# Patient Record
Sex: Female | Born: 1960 | Race: Black or African American | Hispanic: No | Marital: Married | Smoking: Former smoker
Health system: Southern US, Community
[De-identification: ages and names within clinical notes are randomized; demographics above are authoritative.]

## PROBLEM LIST (undated history)

## (undated) DIAGNOSIS — G35 Multiple sclerosis: Secondary | ICD-10-CM

## (undated) DIAGNOSIS — I10 Essential (primary) hypertension: Secondary | ICD-10-CM

## (undated) DIAGNOSIS — D649 Anemia, unspecified: Secondary | ICD-10-CM

## (undated) HISTORY — PX: KNEE CARTILAGE SURGERY: SHX688

---

## 1984-07-18 HISTORY — PX: TUBAL LIGATION: SHX77

## 1992-03-18 DIAGNOSIS — G35 Multiple sclerosis: Secondary | ICD-10-CM

## 1992-03-18 HISTORY — DX: Multiple sclerosis: G35

## 1999-01-22 ENCOUNTER — Emergency Department (HOSPITAL_COMMUNITY): Admission: EM | Admit: 1999-01-22 | Discharge: 1999-01-22 | Payer: Self-pay | Admitting: Emergency Medicine

## 1999-03-09 ENCOUNTER — Ambulatory Visit (HOSPITAL_COMMUNITY): Admission: RE | Admit: 1999-03-09 | Discharge: 1999-03-09 | Payer: Self-pay | Admitting: Gastroenterology

## 2003-01-14 ENCOUNTER — Other Ambulatory Visit: Admission: RE | Admit: 2003-01-14 | Discharge: 2003-01-14 | Payer: Self-pay | Admitting: Obstetrics and Gynecology

## 2004-08-20 ENCOUNTER — Ambulatory Visit (HOSPITAL_COMMUNITY): Admission: RE | Admit: 2004-08-20 | Discharge: 2004-08-20 | Payer: Self-pay | Admitting: Gastroenterology

## 2008-11-26 ENCOUNTER — Encounter: Payer: Self-pay | Admitting: Family Medicine

## 2008-11-26 ENCOUNTER — Encounter (INDEPENDENT_AMBULATORY_CARE_PROVIDER_SITE_OTHER): Payer: Self-pay | Admitting: *Deleted

## 2008-11-26 LAB — CONVERTED CEMR LAB
ALT: 14 units/L
AST: 17 units/L
Albumin: 4.7 g/dL
Alkaline Phosphatase: 75 units/L
BUN: 7 mg/dL
CO2, serum: 28 mmol/L
Calcium: 10 mg/dL
Chloride, Serum: 104 mmol/L
Cholesterol: 171 mg/dL
Creatinine, Ser: 0.74 mg/dL
Globulin: 2.9 g/dL
Glucose, Bld: 92 mg/dL
HDL: 46 mg/dL
Hgb A1c MFr Bld: 5.5 %
LDL Cholesterol: 104 mg/dL
Potassium, serum: 4.9 mmol/L
Sodium, serum: 141 mmol/L
Total Bilirubin: 0.2 mg/dL
Total Protein: 7.6 g/dL
Triglycerides: 104 mg/dL

## 2009-01-06 ENCOUNTER — Ambulatory Visit: Payer: Self-pay | Admitting: Family Medicine

## 2009-01-06 DIAGNOSIS — M25519 Pain in unspecified shoulder: Secondary | ICD-10-CM | POA: Insufficient documentation

## 2009-01-09 ENCOUNTER — Encounter (INDEPENDENT_AMBULATORY_CARE_PROVIDER_SITE_OTHER): Payer: Self-pay | Admitting: *Deleted

## 2010-07-26 ENCOUNTER — Other Ambulatory Visit: Payer: Self-pay | Admitting: Family Medicine

## 2010-07-26 ENCOUNTER — Encounter: Payer: Self-pay | Admitting: Family Medicine

## 2010-07-26 ENCOUNTER — Ambulatory Visit
Admission: RE | Admit: 2010-07-26 | Discharge: 2010-07-26 | Payer: Self-pay | Source: Home / Self Care | Attending: Family Medicine | Admitting: Family Medicine

## 2010-07-26 DIAGNOSIS — E049 Nontoxic goiter, unspecified: Secondary | ICD-10-CM | POA: Insufficient documentation

## 2010-07-26 LAB — HEPATIC FUNCTION PANEL
ALT: 12 U/L (ref 0–35)
AST: 16 U/L (ref 0–37)
Albumin: 3.7 g/dL (ref 3.5–5.2)
Alkaline Phosphatase: 77 U/L (ref 39–117)
Bilirubin, Direct: 0.1 mg/dL (ref 0.0–0.3)
Total Bilirubin: 0.4 mg/dL (ref 0.3–1.2)
Total Protein: 7.1 g/dL (ref 6.0–8.3)

## 2010-07-26 LAB — CBC WITH DIFFERENTIAL/PLATELET
Basophils Absolute: 0 10*3/uL (ref 0.0–0.1)
Basophils Relative: 0.5 % (ref 0.0–3.0)
Eosinophils Absolute: 0.2 10*3/uL (ref 0.0–0.7)
Eosinophils Relative: 2.3 % (ref 0.0–5.0)
HCT: 32.2 % — ABNORMAL LOW (ref 36.0–46.0)
Hemoglobin: 10.3 g/dL — ABNORMAL LOW (ref 12.0–15.0)
Lymphocytes Relative: 37.6 % (ref 12.0–46.0)
Lymphs Abs: 2.5 10*3/uL (ref 0.7–4.0)
MCHC: 32.1 g/dL (ref 30.0–36.0)
MCV: 82.7 fl (ref 78.0–100.0)
Monocytes Absolute: 0.5 10*3/uL (ref 0.1–1.0)
Monocytes Relative: 6.9 % (ref 3.0–12.0)
Neutro Abs: 3.5 10*3/uL (ref 1.4–7.7)
Neutrophils Relative %: 52.7 % (ref 43.0–77.0)
Platelets: 556 10*3/uL — ABNORMAL HIGH (ref 150.0–400.0)
RBC: 3.89 Mil/uL (ref 3.87–5.11)
RDW: 16.2 % — ABNORMAL HIGH (ref 11.5–14.6)
WBC: 6.7 10*3/uL (ref 4.5–10.5)

## 2010-07-26 LAB — BASIC METABOLIC PANEL
BUN: 11 mg/dL (ref 6–23)
CO2: 27 mEq/L (ref 19–32)
Calcium: 9.7 mg/dL (ref 8.4–10.5)
Chloride: 108 mEq/L (ref 96–112)
Creatinine, Ser: 0.6 mg/dL (ref 0.4–1.2)
GFR: 139.24 mL/min (ref >60.00)
Glucose, Bld: 84 mg/dL (ref 70–99)
Potassium: 4.2 mEq/L (ref 3.5–5.1)
Sodium: 139 mEq/L (ref 135–145)

## 2010-07-26 LAB — LIPID PANEL
Cholesterol: 168 mg/dL (ref 0–200)
HDL: 42.9 mg/dL (ref >39.00)
LDL Cholesterol: 116 mg/dL — ABNORMAL HIGH (ref 0–99)
Total CHOL/HDL Ratio: 4
Triglycerides: 48 mg/dL (ref 0.0–149.0)
VLDL: 9.6 mg/dL (ref 0.0–40.0)

## 2010-07-26 LAB — TSH: TSH: 0.28 u[IU]/mL — ABNORMAL LOW (ref 0.35–5.50)

## 2010-07-27 ENCOUNTER — Telehealth (INDEPENDENT_AMBULATORY_CARE_PROVIDER_SITE_OTHER): Payer: Self-pay | Admitting: *Deleted

## 2010-07-27 LAB — T3, FREE: T3, Free: 2 pg/mL — ABNORMAL LOW (ref 2.3–4.2)

## 2010-07-27 LAB — CONVERTED CEMR LAB: Vit D, 25-Hydroxy: 17 ng/mL — ABNORMAL LOW (ref 30–89)

## 2010-07-27 LAB — T4, FREE: Free T4: 0.71 ng/dL (ref 0.60–1.60)

## 2010-07-30 ENCOUNTER — Encounter
Admission: RE | Admit: 2010-07-30 | Discharge: 2010-07-30 | Payer: Self-pay | Source: Home / Self Care | Attending: Family Medicine | Admitting: Family Medicine

## 2010-07-30 ENCOUNTER — Telehealth (INDEPENDENT_AMBULATORY_CARE_PROVIDER_SITE_OTHER): Payer: Self-pay | Admitting: *Deleted

## 2010-08-11 ENCOUNTER — Ambulatory Visit
Admission: RE | Admit: 2010-08-11 | Discharge: 2010-08-11 | Payer: Self-pay | Source: Home / Self Care | Attending: Family Medicine | Admitting: Family Medicine

## 2010-08-11 ENCOUNTER — Other Ambulatory Visit: Payer: Self-pay

## 2010-08-11 LAB — CBC WITH DIFFERENTIAL/PLATELET
Basophils Absolute: 0 10*3/uL (ref 0.0–0.1)
Basophils Relative: 0.5 % (ref 0.0–3.0)
Eosinophils Absolute: 0.3 10*3/uL (ref 0.0–0.7)
Eosinophils Relative: 4.7 % (ref 0.0–5.0)
HCT: 33.4 % — ABNORMAL LOW (ref 36.0–46.0)
Hemoglobin: 10.9 g/dL — ABNORMAL LOW (ref 12.0–15.0)
Lymphocytes Relative: 44.7 % (ref 12.0–46.0)
Lymphs Abs: 2.4 10*3/uL (ref 0.7–4.0)
MCHC: 32.7 g/dL (ref 30.0–36.0)
MCV: 84.5 fl (ref 78.0–100.0)
Monocytes Absolute: 0.4 10*3/uL (ref 0.1–1.0)
Monocytes Relative: 7.2 % (ref 3.0–12.0)
Neutro Abs: 2.3 10*3/uL (ref 1.4–7.7)
Neutrophils Relative %: 42.9 % — ABNORMAL LOW (ref 43.0–77.0)
Platelets: 507 10*3/uL — ABNORMAL HIGH (ref 150.0–400.0)
RBC: 3.96 Mil/uL (ref 3.87–5.11)
RDW: 18.8 % — ABNORMAL HIGH (ref 11.5–14.6)
WBC: 5.4 10*3/uL (ref 4.5–10.5)

## 2010-08-19 NOTE — Progress Notes (Signed)
Summary: Call [phone]=cell for Thyroid Ultrasound results  Phone Note Call from Patient   Caller: Patient Summary of Call: please call patient on her cell today for her Thyroid Ultrasound results----her cell number is [phone] Initial call taken by: Jerolyn Shin,  July 30, 2010 12:21 PM  Follow-up for Phone Call        spoke w/ patient aware of results of ultrasound...Marland KitchenMarland KitchenDoristine Devoid CMA  July 30, 2010 3:28 PM

## 2010-08-19 NOTE — Miscellaneous (Signed)
  Clinical Lists Changes  Observations: Added new observation of TRIGLYC TOT: 104 mg/dL (16/04/9603 5:40) Added new observation of LDL: 104 mg/dL (98/05/9146 8:29) Added new observation of HDL: 46 mg/dL (56/21/3086 5:78) Added new observation of CHOLESTEROL: 171 mg/dL (46/96/2952 8:41) Added new observation of BILI TOTAL: 0.2 mg/dL (32/44/0102 7:25) Added new observation of ALK PHOS: 75 units/L (11/26/2008 9:16) Added new observation of SGPT (ALT): 14 units/L (11/26/2008 9:16) Added new observation of SGOT (AST): 17 units/L (11/26/2008 9:16) Added new observation of PROTEIN, TOT: 7.6 g/dL (36/64/4034 7:42) Added new observation of GLOBULIN TOT: 2.9 g/dL (59/56/3875 6:43) Added new observation of ALBUMIN: 4.7 g/dL (32/95/1884 1:66) Added new observation of CALCIUM: 10.0 mg/dL (01/15/1600 0:93) Added new observation of HGBA1C: 5.5 % (11/26/2008 9:16) Added new observation of GLUCOSE SER: 92 mg/dL (23/55/7322 0:25) Added new observation of CREATININE: 0.74 mg/dL (42/70/6237 6:28) Added new observation of BUN: 7 mg/dL (31/51/7616 0:73) Added new observation of CO2 TOTAL: 28 mmol/L (11/26/2008 9:16) Added new observation of CHLORIDE: 104 mmol/L (11/26/2008 9:16) Added new observation of POTASSIUM: 4.9 mmol/L (11/26/2008 9:16) Added new observation of SODIUM: 141 mmol/L (11/26/2008 9:16)

## 2010-08-19 NOTE — Progress Notes (Signed)
Summary: labs  Phone Note Outgoing Call   Summary of Call: please refer pt to endocrinologist.  had low TSH which should indicate high T3/T4 but T3 is low.  will need additional w/u w/ endo to determine the cause.  level is low.  start 50,000 units weekly x12 weeks and then repeat level  labs show she is mildly anemic- should start iron supplement daily and stool softener as needed b/c these can cause constipation.  platelets are elevated- this could be due to current bronchitis.  repeat CBC in 2 weeks.  dx- thrombophilia    Follow-up for Phone Call        left message on machine ....Marland KitchenMarland KitchenDoristine Devoid CMA  July 27, 2010 2:13 PM   Additional Follow-up for Phone Call Additional follow up Details #1::        Patient is aware and rx needs to be sent to [facility] on [address] and [employer] Patient is coming in on 1.25.11 for repeat lab work for CBC. Additional Follow-up by: Harold Barban,  July 28, 2010 8:25 AM    New/Updated Medications: VITAMIN D (ERGOCALCIFEROL) 50000 UNIT CAPS (ERGOCALCIFEROL) take one tablet weekly x12 weeks Prescriptions: VITAMIN D (ERGOCALCIFEROL) 50000 UNIT CAPS (ERGOCALCIFEROL) take one tablet weekly x12 weeks  #12 x 0   Entered by:   Doristine Devoid CMA   Authorized by:   Neena Rhymes MD   Signed by:   Doristine Devoid CMA on 07/28/2010   Method used:   Electronically to        [facility] [address] (retail)       [address]       [place], [place]  [address]       Ph: [phone]       Fax: [phone]   RxID:   [id number]

## 2010-08-19 NOTE — Letter (Signed)
Summary: Physical & Physician Assessment Form/[facility]  Physical & Physician Assessment Form/[facility]   Imported By: Lanelle Bal 08/03/2010 10:53:00  _____________________________________________________________________  External Attachment:    Type:   Image     Comment:   External Document

## 2010-08-19 NOTE — Assessment & Plan Note (Signed)
Summary: new/shoulder pain/alr   Vital Signs:  Patient profile:   50 year old female Height:      61.50 inches Weight:      182.8 pounds Pulse rate:   68 / minute BP sitting:   124 / 70  (left arm)  Vitals Entered By: Doristine Devoid (January 06, 2009 9:50 AM) CC: new est- R shoulder pain x1 month constant worst at night has been using Aleve    History of Present Illness: 50 yo woman here today to establish care.  no prior PCP.  sees [facility].  c/o r shoulder pain.  pain starts at base of neck and radiates down R arm to elbow and occasionally fingertips tingle.  pt w/ MS- dx'd in 1993, last flair 2000.  went to [place] 1 month ago, drove, pain started and got progressively worse.  pain described as 'throbbing like a toothache'.  taking aleve- within 1 hour throbbing is gone but it returns.  sxs worse at night.  no pain w/ movement.  no weakness of R arm.  no pain w/ overhead motion.  discomfort w/ shrugging shoulders.  Preventive Screening-Counseling & Management  Alcohol-Tobacco     Smoking Status: never      Sexual History:  currently monogamous.        Drug Use:  never.    Allergies (verified): No Known Drug Allergies  Past History:  Past Medical History: MS- dx'd 1993.  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 Neuro:  Complains of numbness and tingling.  Physical Exam  General:  Well-developed,well-nourished,in no acute distress; alert,appropriate and cooperative throughout examination Neck:  + R trapezius spasm Msk:  + R trapezius spasm pain w/ shrugging shoulders, R>L full ROM of  shoulders bilaterally, (-) impingement signs, normal strength and symmetric Pulses:  +2 carotid, radial, ulnar Extremities:  no C/C/E Neurologic:  strength normal in all extremities, sensation intact to light touch, and DTRs symmetrical and normal.     Impression & Recommendations:  Problem # 1:  SHOULDER, PAIN (ICD-719.41) Assessment New pt's shoulder pain is likely due to R trapezius spasm.  no weakness consistent w/ MS flair.  pain improves w/ NSAID- will continue scheduled NSAID.  start muscle relaxer.  advised heating pads, ice after activity.  if no improvement will refer to PT and follow closely for possible neuro involvement/MS.  Pt expresses understanding and is in agreement w/ this plan. Her updated medication list for this problem includes:    Naproxen 500 Mg Tabs (Naproxen) .Marland Kitchen... 1 tab by mouth two times a day x10 days and then as needed.  take w/ food.    Cyclobenzaprine Hcl 10 Mg Tabs (Cyclobenzaprine hcl) .Marland Kitchen... 1 by mouth three times a day as needed for muscle spasm.  will cause drowsiness  Complete Medication List: 1)  Naproxen 500 Mg Tabs (Naproxen) .Marland Kitchen.. 1 tab by mouth two times a day x10 days and then as needed.  take w/ food. 2)  Cyclobenzaprine Hcl 10 Mg Tabs (Cyclobenzaprine hcl) .Marland Kitchen.. 1 by mouth three times a day as needed for muscle spasm.  will cause drowsiness  Patient Instructions: 1)  Please schedule a follow-up appointment in  2 weeks to f/u shoulder pain 2)  Take the Naproxen as directed- do not add any additional ibuprofen, motrin, aleve, etc.  You can add tylenol every 4-6 hours as needed 3)  Use a heating pad or Thermacare wrap as needed 4)  Ice the shoulder after you are particularly active 5)  Massage will be helpful 6)  Call with any questions or concerns 7)  Hang in there!! Prescriptions: CYCLOBENZAPRINE HCL 10 MG  TABS (CYCLOBENZAPRINE HCL) 1 by mouth three times a day as needed for muscle spasm.  will cause drowsiness  #30 x 0   Entered and Authorized  by:   Neena Rhymes MD   Signed by:   Neena Rhymes MD on 01/06/2009   Method used:   Print then Give to Patient   RxID:   [id number] NAPROXEN 500 MG TABS (NAPROXEN) 1 tab by mouth two times a day x10 days and then as needed.  take w/ food.  #60 x 0   Entered and Authorized by:   Neena Rhymes MD   Signed by:   Neena Rhymes MD on 01/06/2009   Method used:   Print then Give to Patient   RxID:   [id number]

## 2010-08-19 NOTE — Assessment & Plan Note (Signed)
Summary: cpx/fasting/new insurance/kn   Vital Signs:  Patient profile:   50 year old female Height:      61.50 inches Weight:      181 pounds BMI:     33.77 Pulse rate:   97 / minute BP sitting:   118 / 72  (left arm)  Vitals Entered By: Doristine Devoid CMA (July 26, 2010 8:12 AM) CC: CPX AND LAB   History of Present Illness: 50 yo woman here today for CPE.  GYN- [facility]).  currently recovering from bronchitis.  Preventive Screening-Counseling & Management  Alcohol-Tobacco     Alcohol drinks/day: <1     Smoking Status: quit     Year Quit: 1998  Caffeine-Diet-Exercise     Does Patient Exercise: yes     Type of exercise: Rush     Exercise (avg: min/session): 30-60     Times/week: <3      Sexual History:  currently monogamous.        Drug Use:  never.    Current Medications (verified): 1)  None  Allergies (verified): No Known Drug Allergies  Past History:  Past medical, surgical, family and social histories (including risk factors) reviewed, and no changes noted (except as noted below).  Past Medical History: Reviewed history from 01/06/2009 and no changes required. MS- dx'd 1993.  Sharene Skeans is neurologist  Past Surgical History: Reviewed history from 01/06/2009 and no changes required. Tubal ligation  Family History: Reviewed history from 01/06/2009 and no changes required. CAD-no HTN-no DM-father STROKE-no COLON CA-no BREAST CA-no  Social History: Reviewed history from 01/06/2009 and no changes required. 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    Physical Exam  General:  Well-developed,well-nourished,in no acute distress; alert,appropriate and cooperative throughout examination Head:  Normocephalic and atraumatic without obvious abnormalities. No apparent alopecia or balding. Eyes:  No corneal or conjunctival inflammation noted. EOMI. Perrla. Funduscopic exam benign, without hemorrhages, exudates or papilledema. Vision grossly normal. Ears:  External ear exam shows no significant lesions or deformities.  Otoscopic examination reveals clear canals, tympanic membranes are intact bilaterally without bulging, retraction, inflammation or discharge. Hearing is grossly normal bilaterally. Nose:  External nasal examination shows no deformity or inflammation. Nasal mucosa are pink and moist without lesions or exudates. Mouth:  Oral mucosa and oropharynx without lesions or exudates.  Teeth in good repair. Neck:  diffusely enlarged Breasts:  gyn Lungs:  Normal respiratory effort, chest expands symmetrically. Lungs are clear to auscultation, no crackles or wheezes. Heart:  Normal rate and regular rhythm. S1 and S2 normal without gallop, murmur, click, rub or other extra sounds. Abdomen:  Bowel sounds positive,abdomen soft and non-tender without masses, organomegaly or hernias noted. Genitalia:  gyn Pulses:  +2 carotid, radial, DP Extremities:  No clubbing, cyanosis, edema, or deformity noted with normal full range of motion of all joints.   Neurologic:  No cranial nerve deficits noted. Station and gait are normal. Plantar reflexes are down-going bilaterally. DTRs are symmetrical throughout. Sensory, motor and coordinative functions appear intact. Skin:  mole on L palm- following w/ derm Cervical Nodes:  No lymphadenopathy noted Psych:  Cognition and judgment appear intact. Alert and  cooperative with normal attention span and concentration. No apparent delusions, illusions, hallucinations   Impression &  Recommendations:  Problem # 1:  PHYSICAL EXAMINATION (ICD-V70.0) Assessment New pt's PE WNL w/ exception of thyromegaly (see below).  check labs.  anticipatory guidance provided. Orders: Venipuncture (16109) TLB-Lipid Panel (80061-LIPID) TLB-BMP (Basic Metabolic Panel-BMET) (80048-METABOL) TLB-CBC Platelet - w/Differential (85025-CBCD) TLB-Hepatic/Liver Function Pnl (80076-HEPATIC) TLB-TSH (Thyroid Stimulating Hormone) (84443-TSH) T-Vitamin D (25-Hydroxy) (60454-09811)  Problem # 2:  THYROMEGALY (ICD-240.9) Assessment: New check [place] Orders: Radiology Referral (Radiology)  Patient Instructions: 1)  Follow up in 1 year or as needed 2)  Your exam looks great!!!  Keep up the good work! 3)  We'll notify you of your lab results 4)  Call with any questions or concerns 5)  Happy New Year!!!   Orders Added: 1)  Venipuncture [36415] 2)  TLB-Lipid Panel [80061-LIPID] 3)  TLB-BMP (Basic Metabolic Panel-BMET) [80048-METABOL] 4)  TLB-CBC Platelet - w/Differential [85025-CBCD] 5)  TLB-Hepatic/Liver Function Pnl [80076-HEPATIC] 6)  TLB-TSH (Thyroid Stimulating Hormone) [84443-TSH] 7)  T-Vitamin D (25-Hydroxy) [91478-29562] 8)  Radiology Referral [Radiology] 9)  Est. Patient 40-64 years [phone]

## 2010-12-03 NOTE — Op Note (Signed)
NAME:  Gabriela Stephens, Gabriela Stephens          ACCOUNT NO.:  [id number]   MEDICAL RECORD NO.:  [id number]            PATIENT TYPE:   LOCATION:                                 FACILITY:   PHYSICIAN:  Anselmo Rod, M.D.       DATE OF BIRTH:   DATE OF PROCEDURE:  DATE OF DISCHARGE:                               OPERATIVE REPORT   DATE OF PROCEDURE:  August 20, 2004.   PROCEDURE PERFORMED:  Screening colonoscopy.   ENDOSCOPIST:  Anselmo Rod, M.D.   INSTRUMENT USED:  Olympus video colonoscope.   INDICATIONS FOR PROCEDURE:  A 50 year old female undergoing screening  colonoscopy to rule out colonic polyps, masses, etc.  Preprocedure  preparation and informed consent was procured from the patient.  The  patient fasted for eight hours prior to the procedure and prepped with a  gallon of Trilyte the night prior to procedure.  The risks and benefits  of the procedure including a 10% miss rate of cancer and polyp were  discussed with the patient as well.   PRE-PROCEDURE EXAM:  Stable vital signs  NECK:  Supple.  Chest clear to  auscultation.  S1 and S2 regular.  Abdomen soft with normal bowel  sounds.   DESCRIPTION OF PROCEDURE:  The patient was placed in the left lateral  decubitus position and sedated with 60 mg of Demerol and 6 mg of Versed  intravenously in slow incremental doses.  Once the patient was  adequately sedated and maintained on low-flow oxygen and continuous  cardiac monitoring, the Olympus video colonoscope was advanced from the  rectum to the cecum with difficulty.  There was significant amount of  residual stool in the colon.  Multiple washes were done.  No masses or  polyps were seen.  There was no evidence of diverticulosis.  Small  lesions could be missed.   IMPRESSION:  Incomplete prep; large amount of residual stool in the  colon.  No masses or polyps seen.  No evidence of diverticulosis.   RECOMMENDATIONS:  1. Continue high fiber diet with liberal fluid intake.  2. Repeat colonoscopy in the next three years.  If the patient      develops any abnormal symptoms in interim she should contact the      office immediately for further recommendations.      Anselmo Rod, M.D.  Electronically Signed    JNM/MEDQ  D:  07/03/2006  T:  07/03/2006  Job:  [id number]

## 2010-12-24 ENCOUNTER — Encounter: Payer: Self-pay | Admitting: Family Medicine

## 2011-07-31 ENCOUNTER — Emergency Department (HOSPITAL_COMMUNITY)
Admission: EM | Admit: 2011-07-31 | Discharge: 2011-07-31 | Disposition: A | Discharge status: Home / Self Care | Payer: BC Managed Care – PPO | Source: Home / Self Care | Attending: Emergency Medicine | Admitting: Emergency Medicine

## 2011-07-31 ENCOUNTER — Encounter (HOSPITAL_COMMUNITY): Payer: Self-pay

## 2011-07-31 ENCOUNTER — Emergency Department (INDEPENDENT_AMBULATORY_CARE_PROVIDER_SITE_OTHER): Payer: BC Managed Care – PPO

## 2011-07-31 DIAGNOSIS — J111 Influenza due to unidentified influenza virus with other respiratory manifestations: Secondary | ICD-10-CM

## 2011-07-31 DIAGNOSIS — R6889 Other general symptoms and signs: Secondary | ICD-10-CM

## 2011-07-31 DIAGNOSIS — J40 Bronchitis, not specified as acute or chronic: Secondary | ICD-10-CM

## 2011-07-31 HISTORY — DX: Multiple sclerosis: G35

## 2011-07-31 MED ORDER — GUAIFENESIN-CODEINE 100-10 MG/5ML PO SYRP: 10.0000 mL | ORAL_SOLUTION | Freq: Four times a day (QID) | ORAL | Status: AC | PRN

## 2011-07-31 MED ORDER — OSELTAMIVIR PHOSPHATE 75 MG PO CAPS: 75.0000 mg | ORAL_CAPSULE | Freq: Two times a day (BID) | ORAL | Status: AC

## 2011-07-31 MED ORDER — AZITHROMYCIN 250 MG PO TABS: ORAL_TABLET | ORAL | Status: AC

## 2011-07-31 NOTE — ED Notes (Signed)
Patient made aware of radiology delays

## 2011-07-31 NOTE — ED Provider Notes (Signed)
Chief Complaint  Patient presents with  . Influenza    cough, sore throat, fever, bodyaches and ear pain    History of Present Illness:  The patient has had a five-day history of cough productive of clear sputum, myalgias, headache, chills, fever, right ear pain, sore throat, nasal congestion, rhinorrhea, and watery eyes.  Review of Systems:  Other than noted above, the patient denies any of the following symptoms. Systemic:  No fever, chills, sweats, fatigue, myalgias, headache, or anorexia. Eye:  No redness, pain or drainage. ENT:  No earache, nasal congestion, rhinorrhea, sinus pressure, or sore throat. Lungs:  No cough, sputum production, wheezing, shortness of breath. Or chest pain. GI:  No nausea, vomiting, abdominal pain or diarrhea. Skin:  No rash or itching.  PMFSH:  Past medical history, family history, social history, meds, and allergies were reviewed.  Physical Exam:   Vital signs:  BP 150/82  Pulse 98  Temp(Src) 100.1 F (37.8 C) (Oral)  Resp 20  SpO2 98%  LMP 07/21/2011 General:  Alert, in no distress. Eye:  No conjunctival injection or drainage. ENT:  TMs and canals were normal, without erythema or inflammation.  Nasal mucosa was clear and uncongested, without drainage.  Mucous membranes were moist.  Pharynx was clear, without exudate or drainage.  There were no oral ulcerations or lesions. Neck:  Supple, no adenopathy, tenderness or mass. Lungs:  No respiratory distress.  Lungs were clear to auscultation, without wheezes, rales or rhonchi.  Breath sounds were clear and equal bilaterally. Heart:  Regular rhythm, without gallops, murmers or rubs. Skin:  Clear, warm, and dry, without rash or lesions.  Labs:  None    Radiology:  Dg Chest 2 View  07/31/2011  *RADIOLOGY REPORT*  Clinical Data: Cough, congestion and fever.  CHEST - 2 VIEW  Comparison: None.  Findings: Lungs are clear. Mild peribronchial thickening is noted. Heart size normal. No pneumothorax or pleual  effusion.  IMPRESSION: Bronchitic change. No focal process.  Original Report Authenticated By: Bernadene Bell. Maricela Curet, M.D.    Medications given in UCC:  None  Assessment:   Diagnoses that have been ruled out:  None  Diagnoses that are still under consideration:  None  Final diagnoses:  Influenza-like illness  Bronchitis      Plan:   1.  The following meds were prescribed:   New Prescriptions   AZITHROMYCIN (ZITHROMAX Z-PAK) 250 MG TABLET    Take as directed.   GUAIFENESIN-CODEINE (GUIATUSS AC) 100-10 MG/5ML SYRUP    Take 10 mLs by mouth 4 (four) times daily as needed for cough.   OSELTAMIVIR (TAMIFLU) 75 MG CAPSULE    Take 1 capsule (75 mg total) by mouth every 12 (twelve) hours.   2.  The patient was instructed in symptomatic care and handouts were given. 3.  The patient was told to return if becoming worse in any way, if no better in 3 or 4 days, and given some red flag symptoms that would indicate earlier return.   Roque Lias, MD 07/31/11 2033

## 2011-07-31 NOTE — ED Notes (Signed)
Pt c/o cough, sore throat, fever, bodyaches, ear pain, headache started 07/26/11

## 2012-01-27 ENCOUNTER — Encounter: Payer: Self-pay | Admitting: Family Medicine

## 2012-02-02 ENCOUNTER — Encounter: Payer: Self-pay | Admitting: Family Medicine

## 2012-02-02 ENCOUNTER — Ambulatory Visit (INDEPENDENT_AMBULATORY_CARE_PROVIDER_SITE_OTHER): Payer: BC Managed Care – PPO | Admitting: Family Medicine

## 2012-02-02 VITALS — BP 135/72 | HR 76 | Temp 98.3°F | Ht 61.5 in | Wt 185.0 lb

## 2012-02-02 DIAGNOSIS — Z0000 Encounter for general adult medical examination without abnormal findings: Secondary | ICD-10-CM | POA: Insufficient documentation

## 2012-02-02 DIAGNOSIS — E049 Nontoxic goiter, unspecified: Secondary | ICD-10-CM

## 2012-02-02 LAB — CBC WITH DIFFERENTIAL/PLATELET
Basophils Absolute: 0.1 10*3/uL (ref 0.0–0.1)
Basophils Relative: 1.1 % (ref 0.0–3.0)
Eosinophils Absolute: 0.2 10*3/uL (ref 0.0–0.7)
Eosinophils Relative: 3.2 % (ref 0.0–5.0)
HCT: 36.4 % (ref 36.0–46.0)
Hemoglobin: 11.8 g/dL — ABNORMAL LOW (ref 12.0–15.0)
Lymphocytes Relative: 37.7 % (ref 12.0–46.0)
Lymphs Abs: 2.5 10*3/uL (ref 0.7–4.0)
MCHC: 32.4 g/dL (ref 30.0–36.0)
MCV: 93.6 fl (ref 78.0–100.0)
Monocytes Absolute: 0.4 10*3/uL (ref 0.1–1.0)
Monocytes Relative: 5.6 % (ref 3.0–12.0)
Neutro Abs: 3.4 10*3/uL (ref 1.4–7.7)
Neutrophils Relative %: 52.4 % (ref 43.0–77.0)
Platelets: 401 10*3/uL — ABNORMAL HIGH (ref 150.0–400.0)
RBC: 3.89 Mil/uL (ref 3.87–5.11)
RDW: 15.5 % — ABNORMAL HIGH (ref 11.5–14.6)
WBC: 6.6 10*3/uL (ref 4.5–10.5)

## 2012-02-02 LAB — TSH: TSH: 0.58 u[IU]/mL (ref 0.35–5.50)

## 2012-02-02 LAB — HEPATIC FUNCTION PANEL
ALT: 14 U/L (ref 0–35)
AST: 18 U/L (ref 0–37)
Albumin: 4.1 g/dL (ref 3.5–5.2)
Alkaline Phosphatase: 63 U/L (ref 39–117)
Bilirubin, Direct: 0 mg/dL (ref 0.0–0.3)
Total Bilirubin: 0.1 mg/dL — ABNORMAL LOW (ref 0.3–1.2)
Total Protein: 7.3 g/dL (ref 6.0–8.3)

## 2012-02-02 LAB — BASIC METABOLIC PANEL
BUN: 10 mg/dL (ref 6–23)
CO2: 27 mEq/L (ref 19–32)
Calcium: 10 mg/dL (ref 8.4–10.5)
Chloride: 104 mEq/L (ref 96–112)
Creatinine, Ser: 0.8 mg/dL (ref 0.4–1.2)
GFR: 103.32 mL/min (ref >60.00)
Glucose, Bld: 89 mg/dL (ref 70–99)
Potassium: 4.4 mEq/L (ref 3.5–5.1)
Sodium: 137 mEq/L (ref 135–145)

## 2012-02-02 LAB — LIPID PANEL
Cholesterol: 176 mg/dL (ref 0–200)
HDL: 49.8 mg/dL (ref >39.00)
LDL Cholesterol: 118 mg/dL — ABNORMAL HIGH (ref 0–99)
Total CHOL/HDL Ratio: 4
Triglycerides: 39 mg/dL (ref 0.0–149.0)
VLDL: 7.8 mg/dL (ref 0.0–40.0)

## 2012-02-02 NOTE — Patient Instructions (Addendum)
Follow up in 1 year or as needed Keep up the good work!  You look great! Someone will call you with your thyroid US appt We'll notify you of your lab results Call with any questions or concerns Have a great time at [facility]!!

## 2012-02-02 NOTE — Progress Notes (Signed)
  Subjective:    Patient ID: Gabriela Stephens, female    DOB: 10/29/1960, 51 y.o.   MRN: [id number]  HPI CPE- no concerns today.  UTD on GYN.  Had colonoscopy 2009 w/ Dr Loreta Ave.   Review of Systems Patient reports no vision/ hearing changes, adenopathy,fever, weight change,  persistant/recurrent hoarseness , swallowing issues, chest pain, palpitations, edema, persistant/recurrent cough, hemoptysis, dyspnea (rest/exertional/paroxysmal nocturnal), gastrointestinal bleeding (melena, rectal bleeding), abdominal pain, significant heartburn, bowel changes, GU symptoms (dysuria, hematuria, incontinence), Gyn symptoms (abnormal  bleeding, pain), syncope, focal weakness, memory loss, numbness & tingling, skin/hair/nail changes, abnormal bruising or bleeding, anxiety, or depression.     Objective:   Physical Exam General Appearance:    Alert, cooperative, no distress, appears stated age  Head:    Normocephalic, without obvious abnormality, atraumatic  Eyes:    PERRL, conjunctiva/corneas clear, EOM's intact, fundi    benign, both eyes  Ears:    Normal TM's and external ear canals, both ears  Nose:   Nares normal, septum midline, mucosa normal, no drainage    or sinus tenderness  Throat:   Lips, mucosa, and tongue normal; teeth and gums normal  Neck:   Supple, symmetrical, trachea midline, no adenopathy;    Thyroid: diffuse enlargement  Back:     Symmetric, no curvature, ROM normal, no CVA tenderness  Lungs:     Clear to auscultation bilaterally, respirations unlabored  Chest Wall:    No tenderness or deformity   Heart:    Regular rate and rhythm, S1 and S2 normal, no murmur, rub   or gallop  Breast Exam:    Deferred to GYN  Abdomen:     Soft, non-tender, bowel sounds active all four quadrants,    no masses, no organomegaly  Genitalia:    Deferred to GYN  Rectal:    Extremities:   Extremities normal, atraumatic, no cyanosis or edema  Pulses:   2+ and symmetric all extremities  Skin:   Skin  color, texture, turgor normal, no rashes or lesions  Lymph nodes:   Cervical, supraclavicular, and axillary nodes normal  Neurologic:   CNII-XII intact, normal strength, sensation and reflexes    throughout          Assessment & Plan:

## 2012-02-03 ENCOUNTER — Encounter: Payer: Self-pay | Admitting: *Deleted

## 2012-02-03 NOTE — Assessment & Plan Note (Signed)
Repeat US as recommended last year.

## 2012-02-03 NOTE — Assessment & Plan Note (Signed)
Pt's PE WNL w/ exception of thyromegaly.  UTD on GYN and colonoscopy.  Check labs.  EKG done- see document for interpretation. Anticipatory guidance provided.

## 2012-02-06 ENCOUNTER — Other Ambulatory Visit: Payer: BC Managed Care – PPO

## 2012-02-06 LAB — VITAMIN D 1,25 DIHYDROXY
Vitamin D 1, 25 (OH)2 Total: 81 pg/mL — ABNORMAL HIGH (ref 18–72)
Vitamin D2 1, 25 (OH)2: 29 pg/mL
Vitamin D3 1, 25 (OH)2: 52 pg/mL

## 2012-02-17 ENCOUNTER — Other Ambulatory Visit: Payer: BC Managed Care – PPO

## 2013-04-08 ENCOUNTER — Ambulatory Visit (INDEPENDENT_AMBULATORY_CARE_PROVIDER_SITE_OTHER)
Admission: RE | Admit: 2013-04-08 | Discharge: 2013-04-08 | Disposition: A | Discharge status: Home / Self Care | Payer: BC Managed Care – PPO | Source: Ambulatory Visit | Attending: Family Medicine | Admitting: Family Medicine

## 2013-04-08 ENCOUNTER — Encounter: Payer: Self-pay | Admitting: Family Medicine

## 2013-04-08 ENCOUNTER — Ambulatory Visit (INDEPENDENT_AMBULATORY_CARE_PROVIDER_SITE_OTHER): Payer: BC Managed Care – PPO | Admitting: Family Medicine

## 2013-04-08 VITALS — BP 124/70 | HR 65 | Temp 98.4°F | Wt 181.6 lb

## 2013-04-08 DIAGNOSIS — K5901 Slow transit constipation: Secondary | ICD-10-CM

## 2013-04-08 NOTE — Patient Instructions (Addendum)

## 2013-04-08 NOTE — Progress Notes (Signed)
  Subjective:     Gabriela Stephens is a 52 y.o. female who presents for evaluation of constipation. Onset was 10 days ago. Patient has been having rare loose and watery stools per week. Defecation has been incomplete. Co-Morbid conditions:neuromuscular disease. Symptoms have gradually worsened. Current Health Habits: Eating fiber? yes - few different ones, Exercise? no, Adequate hydration? yes - 80 oz water. Current over the counter/prescription laxative: bulk (psyllium) and colace, miralax which has been not very effective.  The following portions of the patient's history were reviewed and updated as appropriate: allergies, current medications, past family history, past medical history, past social history, past surgical history and problem list.  Review of Systems Pertinent items are noted in HPI.   Objective:    BP 124/70  Pulse 65  Temp(Src) 98.4 F (36.9 C) (Oral)  Wt 181 lb 9.6 oz (82.373 kg)  BMI 33.76 kg/m2  SpO2 98% General appearance: alert, cooperative, appears stated age and no distress Throat: lips, mucosa, and tongue normal; teeth and gums normal Neck: no adenopathy, supple, symmetrical, trachea midline and thyroid not enlarged, symmetric, no tenderness/mass/nodules Lungs: clear to auscultation bilaterally Abdomen: soft, non-tender; bowel sounds normal; no masses,  no organomegaly   Assessment:    Constipation   Plan:    Education about constipation causes and treatment discussed. Laboratory tests per orders. Plain films (flat plate/upright). f/u prn

## 2013-04-09 ENCOUNTER — Telehealth: Payer: Self-pay | Admitting: Family Medicine

## 2013-04-09 LAB — HEPATIC FUNCTION PANEL
ALT: 14 U/L (ref 0–35)
AST: 22 U/L (ref 0–37)
Albumin: 4.3 g/dL (ref 3.5–5.2)
Alkaline Phosphatase: 77 U/L (ref 39–117)
Bilirubin, Direct: 0 mg/dL (ref 0.0–0.3)
Total Bilirubin: 0.2 mg/dL — ABNORMAL LOW (ref 0.3–1.2)
Total Protein: 7.5 g/dL (ref 6.0–8.3)

## 2013-04-09 LAB — CBC WITH DIFFERENTIAL/PLATELET
Basophils Absolute: 0 10*3/uL (ref 0.0–0.1)
Basophils Relative: 0.7 % (ref 0.0–3.0)
Eosinophils Absolute: 0.3 10*3/uL (ref 0.0–0.7)
Eosinophils Relative: 4.4 % (ref 0.0–5.0)
HCT: 40.6 % (ref 36.0–46.0)
Hemoglobin: 13.3 g/dL (ref 12.0–15.0)
Lymphocytes Relative: 38.5 % (ref 12.0–46.0)
Lymphs Abs: 2.7 10*3/uL (ref 0.7–4.0)
MCHC: 32.6 g/dL (ref 30.0–36.0)
MCV: 92.1 fl (ref 78.0–100.0)
Monocytes Absolute: 0.4 10*3/uL (ref 0.1–1.0)
Monocytes Relative: 6.5 % (ref 3.0–12.0)
Neutro Abs: 3.4 10*3/uL (ref 1.4–7.7)
Neutrophils Relative %: 49.9 % (ref 43.0–77.0)
Platelets: 381 10*3/uL (ref 150.0–400.0)
RBC: 4.41 Mil/uL (ref 3.87–5.11)
RDW: 14.2 % (ref 11.5–14.6)
WBC: 6.9 10*3/uL (ref 4.5–10.5)

## 2013-04-09 LAB — BASIC METABOLIC PANEL
BUN: 11 mg/dL (ref 6–23)
CO2: 30 mEq/L (ref 19–32)
Calcium: 9.6 mg/dL (ref 8.4–10.5)
Chloride: 106 mEq/L (ref 96–112)
Creatinine, Ser: 0.8 mg/dL (ref 0.4–1.2)
GFR: 95.54 mL/min (ref >60.00)
Glucose, Bld: 74 mg/dL (ref 70–99)
Potassium: 4.4 mEq/L (ref 3.5–5.1)
Sodium: 141 mEq/L (ref 135–145)

## 2013-04-09 NOTE — Telephone Encounter (Signed)
Spoke with pt. States she is still having sever abdominal pain and has not used restroom in 12 days. Closing this encounter due to notes placed on lab results as well.

## 2013-04-09 NOTE — Telephone Encounter (Signed)
Patient called today about her x-ray results she had done yesterday. Also to talk about what she needs to do about her being in pain.

## 2013-04-09 NOTE — Telephone Encounter (Signed)
Left a message on pt's home and mobile numbers.

## 2013-04-10 ENCOUNTER — Other Ambulatory Visit: Payer: Self-pay | Admitting: Family Medicine

## 2013-04-10 DIAGNOSIS — R1013 Epigastric pain: Secondary | ICD-10-CM

## 2013-04-12 ENCOUNTER — Encounter: Payer: Self-pay | Admitting: General Practice

## 2013-06-04 ENCOUNTER — Telehealth: Payer: Self-pay

## 2013-06-04 NOTE — Telephone Encounter (Signed)
Left message for call back identifiable  CCS--10/2007 per patient--next 2019 MMG--12/2011--neg Pap--12/2011--neg Tdap 01/2012

## 2013-06-05 ENCOUNTER — Ambulatory Visit (INDEPENDENT_AMBULATORY_CARE_PROVIDER_SITE_OTHER): Payer: BC Managed Care – PPO | Admitting: Family Medicine

## 2013-06-05 ENCOUNTER — Telehealth: Payer: Self-pay | Admitting: Family Medicine

## 2013-06-05 ENCOUNTER — Encounter: Payer: Self-pay | Admitting: Family Medicine

## 2013-06-05 ENCOUNTER — Encounter: Payer: Self-pay | Admitting: General Practice

## 2013-06-05 VITALS — BP 126/80 | HR 70 | Temp 98.1°F | Resp 16 | Ht 62.0 in | Wt 186.0 lb

## 2013-06-05 DIAGNOSIS — N951 Menopausal and female climacteric states: Secondary | ICD-10-CM

## 2013-06-05 DIAGNOSIS — Z0000 Encounter for general adult medical examination without abnormal findings: Secondary | ICD-10-CM

## 2013-06-05 DIAGNOSIS — N926 Irregular menstruation, unspecified: Secondary | ICD-10-CM

## 2013-06-05 DIAGNOSIS — E049 Nontoxic goiter, unspecified: Secondary | ICD-10-CM

## 2013-06-05 LAB — CBC WITH DIFFERENTIAL/PLATELET
Basophils Absolute: 0 10*3/uL (ref 0.0–0.1)
Basophils Relative: 0.8 % (ref 0.0–3.0)
Eosinophils Absolute: 0.3 10*3/uL (ref 0.0–0.7)
Eosinophils Relative: 4.6 % (ref 0.0–5.0)
HCT: 38.3 % (ref 36.0–46.0)
Hemoglobin: 12.8 g/dL (ref 12.0–15.0)
Lymphocytes Relative: 45.9 % (ref 12.0–46.0)
Lymphs Abs: 2.7 10*3/uL (ref 0.7–4.0)
MCHC: 33.5 g/dL (ref 30.0–36.0)
MCV: 91.3 fl (ref 78.0–100.0)
Monocytes Absolute: 0.4 10*3/uL (ref 0.1–1.0)
Monocytes Relative: 6.8 % (ref 3.0–12.0)
Neutro Abs: 2.4 10*3/uL (ref 1.4–7.7)
Neutrophils Relative %: 41.9 % — ABNORMAL LOW (ref 43.0–77.0)
Platelets: 376 10*3/uL (ref 150.0–400.0)
RBC: 4.19 Mil/uL (ref 3.87–5.11)
RDW: 14 % (ref 11.5–14.6)
WBC: 5.8 10*3/uL (ref 4.5–10.5)

## 2013-06-05 LAB — LIPID PANEL
Cholesterol: 190 mg/dL (ref 0–200)
HDL: 49.4 mg/dL (ref >39.00)
LDL Cholesterol: 129 mg/dL — ABNORMAL HIGH (ref 0–99)
Total CHOL/HDL Ratio: 4
Triglycerides: 59 mg/dL (ref 0.0–149.0)
VLDL: 11.8 mg/dL (ref 0.0–40.0)

## 2013-06-05 LAB — HEPATIC FUNCTION PANEL
ALT: 21 U/L (ref 0–35)
AST: 25 U/L (ref 0–37)
Albumin: 4.1 g/dL (ref 3.5–5.2)
Alkaline Phosphatase: 71 U/L (ref 39–117)
Bilirubin, Direct: 0 mg/dL (ref 0.0–0.3)
Total Bilirubin: 0.6 mg/dL (ref 0.3–1.2)
Total Protein: 7.4 g/dL (ref 6.0–8.3)

## 2013-06-05 LAB — BASIC METABOLIC PANEL
BUN: 11 mg/dL (ref 6–23)
CO2: 27 mEq/L (ref 19–32)
Calcium: 9.7 mg/dL (ref 8.4–10.5)
Chloride: 105 mEq/L (ref 96–112)
Creatinine, Ser: 0.7 mg/dL (ref 0.4–1.2)
GFR: 116.85 mL/min (ref >60.00)
Glucose, Bld: 84 mg/dL (ref 70–99)
Potassium: 4.2 mEq/L (ref 3.5–5.1)
Sodium: 136 mEq/L (ref 135–145)

## 2013-06-05 LAB — HM MAMMOGRAPHY: HM Mammogram: NORMAL

## 2013-06-05 LAB — TSH: TSH: 0.38 u[IU]/mL (ref 0.35–5.50)

## 2013-06-05 NOTE — Addendum Note (Signed)
Addended by: Silvio Pate D on: 06/05/2013 04:33 PM   Modules accepted: Orders

## 2013-06-05 NOTE — Patient Instructions (Signed)
Follow up in 1 year or as needed We'll notify you of your lab results and make any changes if needed We'll call you with your [place] appt Keep up the good work!  You look great! Call with any questions or concerns Happy Holidays and Happy Belated Birthday!

## 2013-06-05 NOTE — Addendum Note (Signed)
Addended by: Silvio Pate D on: 06/05/2013 04:31 PM   Modules accepted: Orders

## 2013-06-05 NOTE — Progress Notes (Signed)
  Subjective:    Patient ID: Gabriela Stephens, female    DOB: Apr 27, 1961, 52 y.o.   MRN: [id number]  HPI Pre visit review using our clinic review tool, if applicable. No additional management support is needed unless otherwise documented below in the visit note.  CPE- colonoscopy upcoming on Monday w/ Dr Loreta Ave.  Has mammo today, GYN today Julio Sicks).   Review of Systems Patient reports no vision/ hearing changes, adenopathy,fever, weight change,  persistant/recurrent hoarseness , swallowing issues, chest pain, palpitations, edema, persistant/recurrent cough, hemoptysis, dyspnea (rest/exertional/paroxysmal nocturnal), gastrointestinal bleeding (melena, rectal bleeding), abdominal pain, significant heartburn, bowel changes, GU symptoms (dysuria, hematuria, incontinence), Gyn symptoms (abnormal  bleeding, pain),  syncope, focal weakness, memory loss, numbness & tingling, skin/hair/nail changes, abnormal bruising or bleeding, anxiety, or depression.     Objective:   Physical Exam General Appearance:    Alert, cooperative, no distress, appears stated age  Head:    Normocephalic, without obvious abnormality, atraumatic  Eyes:    PERRL, conjunctiva/corneas clear, EOM's intact, fundi    benign, both eyes  Ears:    Normal TM's and external ear canals, both ears  Nose:   Nares normal, septum midline, mucosa normal, no drainage    or sinus tenderness  Throat:   Lips, mucosa, and tongue normal; teeth and gums normal  Neck:   Supple, symmetrical, trachea midline, no adenopathy;    Thyroid: enlarged w/ R sided nodule  Back:     Symmetric, no curvature, ROM normal, no CVA tenderness  Lungs:     Clear to auscultation bilaterally, respirations unlabored  Chest Wall:    No tenderness or deformity   Heart:    Regular rate and rhythm, S1 and S2 normal, no murmur, rub   or gallop  Breast Exam:    Deferred to GYN  Abdomen:     Soft, non-tender, bowel sounds active all four quadrants,    no masses,  no organomegaly  Genitalia:    Deferred to GYN  Rectal:    Extremities:   Extremities normal, atraumatic, no cyanosis or edema  Pulses:   2+ and symmetric all extremities  Skin:   Skin color, texture, turgor normal, no rashes or lesions  Lymph nodes:   Cervical, supraclavicular, and axillary nodes normal  Neurologic:   CNII-XII intact, normal strength, sensation and reflexes    throughout          Assessment & Plan:

## 2013-06-05 NOTE — Assessment & Plan Note (Signed)
Pt was advised to f/u US last summer- order in but scan not done.  Repeat.

## 2013-06-05 NOTE — Telephone Encounter (Signed)
Labs faxed

## 2013-06-05 NOTE — Telephone Encounter (Signed)
Patient would like for her recent lab results be faxed to [facility])fax number [phone]

## 2013-06-05 NOTE — Assessment & Plan Note (Signed)
Pt's PE WNL w/ exception of known thyromegaly.  UTD on health maintenance.  Check labs.  Anticipatory guidance provided.  

## 2013-06-06 ENCOUNTER — Ambulatory Visit: Payer: BC Managed Care – PPO

## 2013-06-06 ENCOUNTER — Other Ambulatory Visit: Payer: Self-pay | Admitting: Family Medicine

## 2013-06-06 DIAGNOSIS — R6889 Other general symptoms and signs: Secondary | ICD-10-CM

## 2013-06-06 DIAGNOSIS — N926 Irregular menstruation, unspecified: Secondary | ICD-10-CM

## 2013-06-06 DIAGNOSIS — N951 Menopausal and female climacteric states: Secondary | ICD-10-CM

## 2013-06-06 LAB — T3, FREE: T3, Free: 2.5 pg/mL (ref 2.3–4.2)

## 2013-06-06 LAB — T4, FREE: Free T4: 0.68 ng/dL (ref 0.60–1.60)

## 2013-06-06 LAB — TSH: TSH: 0.22 u[IU]/mL — ABNORMAL LOW (ref 0.35–5.50)

## 2013-06-06 LAB — FOLLICLE STIMULATING HORMONE: FSH: 78.1 m[IU]/mL

## 2013-06-06 NOTE — Telephone Encounter (Signed)
Unable to reach pre visit.  

## 2013-06-07 LAB — VITAMIN D 1,25 DIHYDROXY
Vitamin D 1, 25 (OH)2 Total: 69 pg/mL (ref 18–72)
Vitamin D2 1, 25 (OH)2: 10 pg/mL
Vitamin D3 1, 25 (OH)2: 59 pg/mL

## 2013-06-07 LAB — ESTRADIOL: Estradiol: 32.7 pg/mL

## 2013-06-10 ENCOUNTER — Encounter: Payer: Self-pay | Admitting: General Practice

## 2013-06-10 LAB — HM COLONOSCOPY: HM Colonoscopy: 3

## 2013-06-11 ENCOUNTER — Telehealth: Payer: Self-pay | Admitting: General Practice

## 2013-06-11 ENCOUNTER — Encounter: Payer: Self-pay | Admitting: General Practice

## 2013-06-11 NOTE — Telephone Encounter (Signed)
Labs faxed to [facility] per Arvilla Meres. SW

## 2013-06-11 NOTE — Telephone Encounter (Signed)
Called pt to find out GYn name and phone number. I need to fax labs to there office per Tabori.

## 2013-07-28 ENCOUNTER — Encounter (HOSPITAL_COMMUNITY): Payer: Self-pay | Admitting: Emergency Medicine

## 2013-07-28 ENCOUNTER — Emergency Department (HOSPITAL_COMMUNITY)
Admission: EM | Admit: 2013-07-28 | Discharge: 2013-07-28 | Disposition: A | Discharge status: Home / Self Care | Payer: BC Managed Care – PPO | Source: Home / Self Care | Attending: Family Medicine | Admitting: Family Medicine

## 2013-07-28 ENCOUNTER — Emergency Department (INDEPENDENT_AMBULATORY_CARE_PROVIDER_SITE_OTHER): Payer: BC Managed Care – PPO

## 2013-07-28 DIAGNOSIS — M7650 Patellar tendinitis, unspecified knee: Secondary | ICD-10-CM

## 2013-07-28 DIAGNOSIS — M7651 Patellar tendinitis, right knee: Secondary | ICD-10-CM

## 2013-07-28 MED ORDER — DICLOFENAC POTASSIUM 50 MG PO TABS: 50.0000 mg | ORAL_TABLET | Freq: Three times a day (TID) | ORAL | Status: DC

## 2013-07-28 NOTE — ED Notes (Signed)
Started with right knee pain approx 1 month ago after getting up from kneeling.  Pain has gotten progressively worse.  C/O very painful ROM, pain when weightbearing, then as she continues walking pain eases some.  Denies any instability.  Concerned due to MS hx - last flare-up 1998.

## 2013-07-28 NOTE — Discharge Instructions (Signed)
Ice, brace, and medicine as needed, activity as tolerated, see orthopedist for further care as needed.

## 2013-07-28 NOTE — ED Provider Notes (Signed)
CSN: [id number]     Arrival date & time 07/28/13  1049 History   First MD Initiated Contact with Patient 07/28/13 1220     Chief Complaint  Patient presents with  . Knee Pain   (Consider location/radiation/quality/duration/timing/severity/associated sxs/prior Treatment) Patient is a 53 y.o. female presenting with knee pain. The history is provided by the patient and the spouse.  Knee Pain Location:  Knee Injury: yes   Mechanism of injury comment:  Kneeling at church. Knee location:  R knee Pain details:    Quality:  Sharp   Radiates to:  Does not radiate   Severity:  Moderate   Onset quality:  Gradual   Duration:  1 month   Progression:  Worsening Chronicity:  New Dislocation: no   Foreign body present:  No foreign bodies Prior injury to area:  No Worsened by:  Bearing weight and exercise Associated symptoms: no back pain   Risk factors comment:  Has MS   Past Medical History  Diagnosis Date  . Multiple sclerosis    History reviewed. No pertinent past surgical history. No family history on file. History  Substance Use Topics  . Smoking status: Never Smoker   . Smokeless tobacco: Not on file  . Alcohol Use: No   OB History   Grav Para Term Preterm Abortions TAB SAB Ect Mult Living                 Review of Systems  Constitutional: Negative.   Musculoskeletal: Positive for gait problem. Negative for back pain and joint swelling.    Allergies  Review of patient's allergies indicates no known allergies.  Home Medications   Current Outpatient Rx  Name  Route  Sig  Dispense  Refill  . diclofenac (CATAFLAM) 50 MG tablet   Oral   Take 1 tablet (50 mg total) by mouth 3 (three) times daily.   30 tablet   0    BP 113/60  Pulse 63  Temp(Src) 97.3 F (36.3 C) (Oral)  Resp 14  SpO2 100%  LMP 12/31/2011 Physical Exam  Nursing note and vitals reviewed. Constitutional: She is oriented to person, place, and time. She appears well-developed and well-nourished.   Musculoskeletal: She exhibits tenderness.       Right knee: She exhibits decreased range of motion, swelling and abnormal patellar mobility. She exhibits no effusion, no LCL laxity, normal meniscus and no MCL laxity. Tenderness found. Patellar tendon tenderness noted.  Neurological: She is alert and oriented to person, place, and time.  Skin: Skin is warm and dry.    ED Course  Procedures (including critical care time) Labs Review Labs Reviewed - No data to display Imaging Review Dg Knee Complete 4 Views Right  07/28/2013   CLINICAL DATA:  Right knee pain  EXAM: RIGHT KNEE - COMPLETE 4+ VIEW  COMPARISON:  None.  FINDINGS: Five views of the right knee submitted. No acute fracture or subluxation. No radiopaque foreign body. No joint effusion. Mild spurring of patella.  IMPRESSION: Negative.   Electronically Signed   By: Lahoma Crocker M.D.   On: 07/28/2013 12:33    EKG Interpretation    Date/Time:    Ventricular Rate:    PR Interval:    QRS Duration:   QT Interval:    QTC Calculation:   R Axis:     Text Interpretation:              MDM  X-rays reviewed and report per radiologist.  Billy Fischer, MD 07/28/13 2000

## 2013-12-19 ENCOUNTER — Ambulatory Visit: Payer: BC Managed Care – PPO | Admitting: Family Medicine

## 2014-01-16 NOTE — H&P (Signed)
Lexis Potenza Mohamud  DICTATION # [id number] CSN# [id number]   Margarette Asal, MD 01/16/2014 2:02 PM

## 2014-01-17 ENCOUNTER — Encounter (HOSPITAL_COMMUNITY): Payer: Self-pay | Admitting: Pharmacist

## 2014-01-17 NOTE — H&P (Signed)
Gabriela Stephens, Gabriela Stephens          ACCOUNT NO.:  [id number]  MEDICAL RECORD NO.:  [id number]  LOCATION:                                 FACILITY:  PHYSICIAN:  Ralene Bathe. Matthew Saras, M.D.DATE OF BIRTH:  08-18-1960  DATE OF ADMISSION:  01/27/2014 DATE OF DISCHARGE:  01/29/2014                             HISTORY & PHYSICAL   CHIEF COMPLAINT:  Symptomatic fibroids.  HISTORY OF PRESENT ILLNESS:  A 53 year old, G2, P2, prior tubal.  This patient has been followed in our office by nurse practitioner with known uterine fibroids.  Despite OCP, she has continued to have problems with pelvic pain, and on most recent exam, ultrasound on April 2015, demonstrated multiple myomas, 3.4, appears to be possibly degenerating, 2.1, 3.3, 1.9.  No free fluid.  Adnexa negative.  She presents at this time for LAVH, bilateral salpingectomy.  The rationale for salpingectomy and possibility of reducing later life ovarian cancer risk discussed. She would prefer to retain her otherwise normal ovaries.  This procedure including specifics related to bleeding, infection, transfusion, adjacent organ injury, other risks related to wound infection, phlebitis along with her recovery time discussed.  Possible need to complete the surgery by open technique reviewed with her also.  PAST MEDICAL HISTORY:  ALLERGIES:  None.  CURRENT MEDICATIONS:  None.  OBSTETRICAL HISTORY:  She has had two vaginal deliveries in 1984 and 1986.  REVIEW OF SYSTEMS:  Significant for history of IBS.  FAMILY HISTORY:  Significant for diabetes, otherwise negative.  SOCIAL HISTORY:  Denies alcohol, tobacco, or drug use.  She is married. Dr. Birdie Riddle is her medical doctor.  PHYSICAL EXAMINATION:  VITAL SIGNS:  Temperature 98.2, blood pressure 120/72. HEENT:  Unremarkable. NECK:  Supple without masses. LUNGS:  Clear. CARDIOVASCULAR:  Regular rate and rhythm without murmurs, rubs, or gallops. BREASTS:  Without masses. ABDOMEN:  Soft,  flat, nontender.  Vulva, vagina, cervix normal.  Last Pap in November 2014 was normal.  Uterus 10-week size, mobile.  Adnexa negative. EXTREMITIES:  Unremarkable. NEUROLOGIC:  Unremarkable.  IMPRESSION:  Symptomatic leiomyoma with anemia.  PLAN:  LAVH, bilateral salpingectomy.  Procedure and risks were discussed as above.     Shermar Friedland M. Matthew Saras, M.D.     [facility]/MEDQ  D:  01/16/2014  T:  01/16/2014  Job:  [id number]

## 2014-01-24 ENCOUNTER — Encounter (HOSPITAL_COMMUNITY)
Admission: RE | Admit: 2014-01-24 | Discharge: 2014-01-24 | Disposition: A | Discharge status: Home / Self Care | Payer: BC Managed Care – PPO | Source: Ambulatory Visit | Attending: Obstetrics and Gynecology | Admitting: Obstetrics and Gynecology

## 2014-01-24 ENCOUNTER — Encounter (HOSPITAL_COMMUNITY): Payer: Self-pay

## 2014-01-24 DIAGNOSIS — Z01812 Encounter for preprocedural laboratory examination: Secondary | ICD-10-CM

## 2014-01-24 LAB — CBC
HCT: 35.7 % — ABNORMAL LOW (ref 36.0–46.0)
Hemoglobin: 11.3 g/dL — ABNORMAL LOW (ref 12.0–15.0)
MCH: 28.4 pg (ref 26.0–34.0)
MCHC: 31.7 g/dL (ref 30.0–36.0)
MCV: 89.7 fL (ref 78.0–100.0)
Platelets: 453 10*3/uL — ABNORMAL HIGH (ref 150–400)
RBC: 3.98 MIL/uL (ref 3.87–5.11)
RDW: 13.8 % (ref 11.5–15.5)
WBC: 5.8 10*3/uL (ref 4.0–10.5)

## 2014-01-24 NOTE — Patient Instructions (Signed)
[place]  01/24/2014   Your procedure is scheduled on:  01/27/14  Enter through the Main Entrance of [facility] [place] up the phone at the desk and dial [phone].   Call this number if you have problems the morning of surgery: [phone]   Remember:   Do not eat food:After Midnight.  Do not drink clear liquids: After Midnight.  Take these medicines the morning of surgery with A SIP OF WATER: NA   Do not wear jewelry, make-up or nail polish.  Do not wear lotions, powders, or perfumes. You may wear deodorant.  Do not shave 48 hours prior to surgery.  Do not bring valuables to the hospital.  [facility] is not   responsible for any belongings or valuables brought to the hospital.  Contacts, dentures or bridgework may not be worn into surgery.  Leave suitcase in the car. After surgery it may be brought to your room.  For patients admitted to the hospital, checkout time is 11:00 AM the day of              discharge.   Patients discharged the day of surgery will not be allowed to drive             home.  Name and phone number of your driver: NA  Special Instructions:      Please read over the following fact sheets that you were given:   Surgical Site Infection Prevention

## 2014-01-26 MED ORDER — DEXTROSE 5 % IV SOLN
2.0000 g | INTRAVENOUS | Status: AC
  Administered 2014-01-27: 2 g via INTRAVENOUS
  Filled 2014-01-26: qty 2

## 2014-01-27 ENCOUNTER — Encounter (HOSPITAL_COMMUNITY): Payer: BC Managed Care – PPO | Admitting: Anesthesiology

## 2014-01-27 ENCOUNTER — Encounter (HOSPITAL_COMMUNITY)
Admission: RE | Disposition: A | Discharge status: Home / Self Care | Payer: Self-pay | Source: Ambulatory Visit | Attending: Obstetrics and Gynecology

## 2014-01-27 ENCOUNTER — Encounter (HOSPITAL_COMMUNITY): Payer: Self-pay

## 2014-01-27 ENCOUNTER — Observation Stay (HOSPITAL_COMMUNITY)
Admission: RE | Admit: 2014-01-27 | Discharge: 2014-01-29 | Disposition: A | Discharge status: Home / Self Care | Payer: BC Managed Care – PPO | Source: Ambulatory Visit | Attending: Obstetrics and Gynecology | Admitting: Obstetrics and Gynecology

## 2014-01-27 ENCOUNTER — Ambulatory Visit (HOSPITAL_COMMUNITY): Payer: BC Managed Care – PPO | Admitting: Anesthesiology

## 2014-01-27 DIAGNOSIS — N8000 Endometriosis of the uterus, unspecified: Secondary | ICD-10-CM | POA: Insufficient documentation

## 2014-01-27 DIAGNOSIS — Z833 Family history of diabetes mellitus: Secondary | ICD-10-CM | POA: Insufficient documentation

## 2014-01-27 DIAGNOSIS — D219 Benign neoplasm of connective and other soft tissue, unspecified: Secondary | ICD-10-CM | POA: Diagnosis present

## 2014-01-27 DIAGNOSIS — N838 Other noninflammatory disorders of ovary, fallopian tube and broad ligament: Secondary | ICD-10-CM | POA: Insufficient documentation

## 2014-01-27 DIAGNOSIS — D251 Intramural leiomyoma of uterus: Principal | ICD-10-CM | POA: Insufficient documentation

## 2014-01-27 DIAGNOSIS — Z87891 Personal history of nicotine dependence: Secondary | ICD-10-CM | POA: Insufficient documentation

## 2014-01-27 DIAGNOSIS — Z6836 Body mass index (BMI) 36.0-36.9, adult: Secondary | ICD-10-CM | POA: Insufficient documentation

## 2014-01-27 DIAGNOSIS — D649 Anemia, unspecified: Secondary | ICD-10-CM | POA: Insufficient documentation

## 2014-01-27 HISTORY — PX: LAPAROSCOPIC ASSISTED VAGINAL HYSTERECTOMY: SHX5398

## 2014-01-27 LAB — PREGNANCY, URINE: Preg Test, Ur: NEGATIVE

## 2014-01-27 SURGERY — HYSTERECTOMY, VAGINAL, LAPAROSCOPY-ASSISTED
Anesthesia: General | Laterality: Bilateral

## 2014-01-27 MED ORDER — ONDANSETRON HCL 4 MG/2ML IJ SOLN: 4.0000 mg | Freq: Four times a day (QID) | INTRAMUSCULAR | Status: DC | PRN

## 2014-01-27 MED ORDER — HYDROMORPHONE HCL PF 1 MG/ML IJ SOLN: 0.2500 mg | INTRAMUSCULAR | Status: DC | PRN

## 2014-01-27 MED ORDER — NEOSTIGMINE METHYLSULFATE 10 MG/10ML IV SOLN
INTRAVENOUS | Status: AC
  Filled 2014-01-27: qty 1

## 2014-01-27 MED ORDER — ONDANSETRON HCL 4 MG/2ML IJ SOLN
INTRAMUSCULAR | Status: DC | PRN
  Administered 2014-01-27 (×2): 2 mg via INTRAVENOUS

## 2014-01-27 MED ORDER — OXYCODONE-ACETAMINOPHEN 5-325 MG PO TABS: 1.0000 | ORAL_TABLET | ORAL | Status: DC | PRN

## 2014-01-27 MED ORDER — ONDANSETRON HCL 4 MG PO TABS: 4.0000 mg | ORAL_TABLET | Freq: Four times a day (QID) | ORAL | Status: DC | PRN

## 2014-01-27 MED ORDER — KETOROLAC TROMETHAMINE 30 MG/ML IJ SOLN
30.0000 mg | Freq: Four times a day (QID) | INTRAMUSCULAR | Status: DC
  Administered 2014-01-27 – 2014-01-28 (×3): 30 mg via INTRAVENOUS
  Filled 2014-01-27 (×3): qty 1

## 2014-01-27 MED ORDER — PROPOFOL 10 MG/ML IV BOLUS
INTRAVENOUS | Status: DC | PRN
  Administered 2014-01-27: 200 mg via INTRAVENOUS

## 2014-01-27 MED ORDER — DEXAMETHASONE SODIUM PHOSPHATE 10 MG/ML IJ SOLN
INTRAMUSCULAR | Status: DC | PRN
  Administered 2014-01-27: 10 mg via INTRAVENOUS

## 2014-01-27 MED ORDER — MORPHINE SULFATE (PF) 1 MG/ML IV SOLN
INTRAVENOUS | Status: DC
  Administered 2014-01-27: 11:00:00 via INTRAVENOUS
  Administered 2014-01-27: 3 mg via INTRAVENOUS
  Administered 2014-01-27 (×2): 5 mg via INTRAVENOUS
  Administered 2014-01-28: 2 mg via INTRAVENOUS
  Administered 2014-01-28: 1 mg via INTRAVENOUS
  Filled 2014-01-27: qty 25

## 2014-01-27 MED ORDER — SODIUM CHLORIDE 0.9 % IJ SOLN: 9.0000 mL | INTRAMUSCULAR | Status: DC | PRN

## 2014-01-27 MED ORDER — BUTORPHANOL TARTRATE 1 MG/ML IJ SOLN: 1.0000 mg | INTRAMUSCULAR | Status: DC | PRN

## 2014-01-27 MED ORDER — IBUPROFEN 800 MG PO TABS
800.0000 mg | ORAL_TABLET | Freq: Three times a day (TID) | ORAL | Status: DC | PRN
  Administered 2014-01-28 – 2014-01-29 (×3): 800 mg via ORAL
  Filled 2014-01-27 (×4): qty 1

## 2014-01-27 MED ORDER — PHENYLEPHRINE 40 MCG/ML (10ML) SYRINGE FOR IV PUSH (FOR BLOOD PRESSURE SUPPORT)
PREFILLED_SYRINGE | INTRAVENOUS | Status: AC
  Filled 2014-01-27: qty 5

## 2014-01-27 MED ORDER — LIDOCAINE HCL (CARDIAC) 20 MG/ML IV SOLN
INTRAVENOUS | Status: DC | PRN
  Administered 2014-01-27: 80 mg via INTRAVENOUS

## 2014-01-27 MED ORDER — BUPIVACAINE HCL (PF) 0.25 % IJ SOLN
INTRAMUSCULAR | Status: DC | PRN
  Administered 2014-01-27: 8 mL

## 2014-01-27 MED ORDER — GLYCOPYRROLATE 0.2 MG/ML IJ SOLN
INTRAMUSCULAR | Status: DC | PRN
  Administered 2014-01-27 (×2): .1 mg via INTRAVENOUS
  Administered 2014-01-27: 0.4 mg via INTRAVENOUS

## 2014-01-27 MED ORDER — KETOROLAC TROMETHAMINE 30 MG/ML IJ SOLN: 30.0000 mg | Freq: Once | INTRAMUSCULAR | Status: DC

## 2014-01-27 MED ORDER — MIDAZOLAM HCL 2 MG/2ML IJ SOLN
INTRAMUSCULAR | Status: AC
  Filled 2014-01-27: qty 2

## 2014-01-27 MED ORDER — PHENYLEPHRINE HCL 10 MG/ML IJ SOLN
INTRAMUSCULAR | Status: DC | PRN
  Administered 2014-01-27 (×3): .04 mg via INTRAVENOUS

## 2014-01-27 MED ORDER — HYDROMORPHONE HCL PF 1 MG/ML IJ SOLN
INTRAMUSCULAR | Status: DC | PRN
  Administered 2014-01-27: 1 mg via INTRAVENOUS

## 2014-01-27 MED ORDER — LACTATED RINGERS IR SOLN
Status: DC | PRN
  Administered 2014-01-27: 3000 mL

## 2014-01-27 MED ORDER — MIDAZOLAM HCL 2 MG/2ML IJ SOLN
INTRAMUSCULAR | Status: DC | PRN
  Administered 2014-01-27: 0.5 mg via INTRAVENOUS
  Administered 2014-01-27: 1.5 mg via INTRAVENOUS

## 2014-01-27 MED ORDER — DEXAMETHASONE SODIUM PHOSPHATE 10 MG/ML IJ SOLN
INTRAMUSCULAR | Status: AC
  Filled 2014-01-27: qty 1

## 2014-01-27 MED ORDER — KETOROLAC TROMETHAMINE 30 MG/ML IJ SOLN: 30.0000 mg | Freq: Four times a day (QID) | INTRAMUSCULAR | Status: DC

## 2014-01-27 MED ORDER — MENTHOL 3 MG MT LOZG: 1.0000 | LOZENGE | OROMUCOSAL | Status: DC | PRN

## 2014-01-27 MED ORDER — NALOXONE HCL 0.4 MG/ML IJ SOLN: 0.4000 mg | INTRAMUSCULAR | Status: DC | PRN

## 2014-01-27 MED ORDER — ONDANSETRON HCL 4 MG/2ML IJ SOLN
INTRAMUSCULAR | Status: AC
  Filled 2014-01-27: qty 2

## 2014-01-27 MED ORDER — KETOROLAC TROMETHAMINE 30 MG/ML IJ SOLN
INTRAMUSCULAR | Status: DC | PRN
  Administered 2014-01-27: 30 mg via INTRAVENOUS

## 2014-01-27 MED ORDER — BUPIVACAINE HCL (PF) 0.25 % IJ SOLN
INTRAMUSCULAR | Status: AC
  Filled 2014-01-27: qty 30

## 2014-01-27 MED ORDER — LACTATED RINGERS IV SOLN
INTRAVENOUS | Status: DC
  Administered 2014-01-27 (×2): via INTRAVENOUS

## 2014-01-27 MED ORDER — PROPOFOL 10 MG/ML IV EMUL
INTRAVENOUS | Status: AC
  Filled 2014-01-27: qty 20

## 2014-01-27 MED ORDER — PROMETHAZINE HCL 25 MG/ML IJ SOLN: 6.2500 mg | INTRAMUSCULAR | Status: DC | PRN

## 2014-01-27 MED ORDER — HYDROMORPHONE HCL PF 1 MG/ML IJ SOLN
INTRAMUSCULAR | Status: AC
  Filled 2014-01-27: qty 1

## 2014-01-27 MED ORDER — FENTANYL CITRATE 0.05 MG/ML IJ SOLN
INTRAMUSCULAR | Status: AC
  Filled 2014-01-27: qty 5

## 2014-01-27 MED ORDER — GLYCOPYRROLATE 0.2 MG/ML IJ SOLN
INTRAMUSCULAR | Status: AC
  Filled 2014-01-27: qty 3

## 2014-01-27 MED ORDER — DIPHENHYDRAMINE HCL 12.5 MG/5ML PO ELIX: 12.5000 mg | ORAL_SOLUTION | Freq: Four times a day (QID) | ORAL | Status: DC | PRN

## 2014-01-27 MED ORDER — DIPHENHYDRAMINE HCL 50 MG/ML IJ SOLN: 12.5000 mg | Freq: Four times a day (QID) | INTRAMUSCULAR | Status: DC | PRN

## 2014-01-27 MED ORDER — KETOROLAC TROMETHAMINE 30 MG/ML IJ SOLN: 15.0000 mg | Freq: Once | INTRAMUSCULAR | Status: DC | PRN

## 2014-01-27 MED ORDER — DEXTROSE IN LACTATED RINGERS 5 % IV SOLN
INTRAVENOUS | Status: DC
  Administered 2014-01-27 – 2014-01-28 (×3): via INTRAVENOUS

## 2014-01-27 MED ORDER — LIDOCAINE HCL (CARDIAC) 20 MG/ML IV SOLN
INTRAVENOUS | Status: AC
  Filled 2014-01-27: qty 5

## 2014-01-27 MED ORDER — FENTANYL CITRATE 0.05 MG/ML IJ SOLN
INTRAMUSCULAR | Status: DC | PRN
  Administered 2014-01-27 (×2): 100 ug via INTRAVENOUS
  Administered 2014-01-27: 50 ug via INTRAVENOUS

## 2014-01-27 MED ORDER — KETOROLAC TROMETHAMINE 30 MG/ML IJ SOLN
INTRAMUSCULAR | Status: AC
  Filled 2014-01-27: qty 1

## 2014-01-27 MED ORDER — ROCURONIUM BROMIDE 100 MG/10ML IV SOLN
INTRAVENOUS | Status: AC
  Filled 2014-01-27: qty 1

## 2014-01-27 MED ORDER — MIDAZOLAM HCL 2 MG/2ML IJ SOLN: 0.5000 mg | Freq: Once | INTRAMUSCULAR | Status: DC | PRN

## 2014-01-27 MED ORDER — ROCURONIUM BROMIDE 100 MG/10ML IV SOLN
INTRAVENOUS | Status: DC | PRN
  Administered 2014-01-27: 50 mg via INTRAVENOUS
  Administered 2014-01-27: 10 mg via INTRAVENOUS

## 2014-01-27 MED ORDER — NEOSTIGMINE METHYLSULFATE 10 MG/10ML IV SOLN
INTRAVENOUS | Status: DC | PRN
  Administered 2014-01-27: 3 mg via INTRAVENOUS

## 2014-01-27 MED ORDER — MEPERIDINE HCL 25 MG/ML IJ SOLN: 6.2500 mg | INTRAMUSCULAR | Status: DC | PRN

## 2014-01-27 SURGICAL SUPPLY — 41 items
ADH SKN CLS APL DERMABOND .7 (GAUZE/BANDAGES/DRESSINGS) ×1
CABLE HIGH FREQUENCY MONO STRZ (ELECTRODE) IMPLANT
CATH ROBINSON RED A/P 16FR (CATHETERS) ×1 IMPLANT
CLOTH BEACON ORANGE TIMEOUT ST (SAFETY) ×2 IMPLANT
CONT PATH 16OZ SNAP LID 3702 (MISCELLANEOUS) ×2 IMPLANT
COVER TABLE BACK 60X90 (DRAPES) ×2 IMPLANT
DECANTER SPIKE VIAL GLASS SM (MISCELLANEOUS) IMPLANT
DERMABOND ADVANCED (GAUZE/BANDAGES/DRESSINGS) ×1
DERMABOND ADVANCED .7 DNX12 (GAUZE/BANDAGES/DRESSINGS) ×2 IMPLANT
DRSG COVADERM PLUS 2X2 (GAUZE/BANDAGES/DRESSINGS) ×3 IMPLANT
DRSG OPSITE POSTOP 3X4 (GAUZE/BANDAGES/DRESSINGS) ×1 IMPLANT
DURAPREP 26ML APPLICATOR (WOUND CARE) ×2 IMPLANT
ELECT LIGASURE SHORT 9 REUSE (ELECTRODE) ×2 IMPLANT
ELECT REM PT RETURN 9FT ADLT (ELECTROSURGICAL) ×2
ELECTRODE REM PT RTRN 9FT ADLT (ELECTROSURGICAL) ×1 IMPLANT
GLOVE BIO SURGEON STRL SZ7 (GLOVE) ×4 IMPLANT
GLOVE BIOGEL PI IND STRL 6.5 (GLOVE) ×1 IMPLANT
GLOVE BIOGEL PI IND STRL 7.0 (GLOVE) ×2 IMPLANT
GLOVE BIOGEL PI INDICATOR 6.5 (GLOVE) ×1
GLOVE BIOGEL PI INDICATOR 7.0 (GLOVE) ×2
GOWN STRL REUS W/ TWL LRG LVL3 (GOWN DISPOSABLE) ×7 IMPLANT
GOWN STRL REUS W/TWL LRG LVL3 (GOWN DISPOSABLE) ×14
NEEDLE INSUFFLATION 120MM (ENDOMECHANICALS) ×2 IMPLANT
NS IRRIG 1000ML POUR BTL (IV SOLUTION) ×2 IMPLANT
PACK LAVH (CUSTOM PROCEDURE TRAY) ×2 IMPLANT
PROTECTOR NERVE ULNAR (MISCELLANEOUS) ×2 IMPLANT
SEALER TISSUE G2 CVD JAW 45CM (ENDOMECHANICALS) ×2 IMPLANT
SET IRRIG TUBING LAPAROSCOPIC (IRRIGATION / IRRIGATOR) ×1 IMPLANT
SUT MON AB 2-0 CT1 36 (SUTURE) ×4 IMPLANT
SUT VIC AB 0 CT1 18XCR BRD8 (SUTURE) ×3 IMPLANT
SUT VIC AB 0 CT1 36 (SUTURE) ×2 IMPLANT
SUT VIC AB 0 CT1 8-18 (SUTURE) ×6
SUT VICRYL 0 TIES 12 18 (SUTURE) ×2 IMPLANT
SUT VICRYL 0 UR6 27IN ABS (SUTURE) ×1 IMPLANT
SUT VICRYL 4-0 PS2 18IN ABS (SUTURE) ×2 IMPLANT
TOWEL OR 17X24 6PK STRL BLUE (TOWEL DISPOSABLE) ×4 IMPLANT
TRAY FOLEY CATH 14FR (SET/KITS/TRAYS/PACK) ×2 IMPLANT
TROCAR OPTI TIP 5M 100M (ENDOMECHANICALS) ×2 IMPLANT
TROCAR XCEL DIL TIP R 11M (ENDOMECHANICALS) ×2 IMPLANT
WARMER LAPAROSCOPE (MISCELLANEOUS) ×2 IMPLANT
WATER STERILE IRR 1000ML POUR (IV SOLUTION) ×2 IMPLANT

## 2014-01-27 NOTE — Transfer of Care (Signed)
Immediate Anesthesia Transfer of Care Note  Patient: Gabriela Stephens  Procedure(s) Performed: Procedure(s): LAPAROSCOPIC ASSISTED VAGINAL HYSTERECTOMY, BILATERAL SALPINGECTOMY (Bilateral)  Patient Location: PACU  Anesthesia Type:General  Level of Consciousness: awake, alert  and oriented  Airway & Oxygen Therapy: Patient Spontanous Breathing and Patient connected to nasal cannula oxygen  Post-op Assessment: Report given to PACU RN, Post -op Vital signs reviewed and stable and Patient moving all extremities X 4  Post vital signs: Reviewed and stable  Complications: No apparent anesthesia complications

## 2014-01-27 NOTE — Anesthesia Postprocedure Evaluation (Signed)
  Anesthesia Post-op Note  Patient: Gabriela Stephens  Procedure(s) Performed: Procedure(s): LAPAROSCOPIC ASSISTED VAGINAL HYSTERECTOMY, BILATERAL SALPINGECTOMY (Bilateral)  Patient Location: PACU and Women's Unit  Anesthesia Type:General  Level of Consciousness: awake, alert  and oriented  Airway and Oxygen Therapy: Patient Spontanous Breathing and Patient connected to nasal cannula oxygen  Post-op Pain: mild  Post-op Assessment: Post-op Vital signs reviewed  Post-op Vital Signs: Reviewed and stable  Last Vitals:  Filed Vitals:   01/27/14 1447  BP:   Pulse:   Temp:   Resp: 20    Complications: No apparent anesthesia complications

## 2014-01-27 NOTE — OR Nursing (Signed)
Dr Lyndle Herrlich states he will sign patient out. Darin Engels rn

## 2014-01-27 NOTE — Addendum Note (Signed)
Addendum created 01/27/14 1524 by Genevie Ann, CRNA   Modules edited: Notes Section   Notes Section:  File: [id number]

## 2014-01-27 NOTE — Anesthesia Postprocedure Evaluation (Signed)
  Anesthesia Post-op Note  Patient: Gabriela Stephens  Procedure(s) Performed: Procedure(s): LAPAROSCOPIC ASSISTED VAGINAL HYSTERECTOMY, BILATERAL SALPINGECTOMY (Bilateral) Patient is awake and responsive. Pain and nausea are reasonably well controlled. Vital signs are stable and clinically acceptable. Oxygen saturation is clinically acceptable. There are no apparent anesthetic complications at this time. Patient is ready for discharge.

## 2014-01-27 NOTE — Progress Notes (Signed)
The patient was re-examined with no change in status 

## 2014-01-27 NOTE — Op Note (Signed)
Preoperative diagnosis: Symptomatic leiomyoma  Postoperative diagnosis: Same  Procedure: LAVH, bilateral salpingectomy  Surgeon: Matthew Saras  Assistant: McComb  EBL: 200 cc  Drains: Foley catheter, to straight drain  Specimens removed: Uterus, bilateral tubes, to pathology  Procedure and findings:  The patient taken the operating room after an adequate level of general anesthesia was obtained with the patient's legs in stirrups the abdomen perineum and vagina were prepped and draped in the normal fashion, Foley catheter was positioned. Appropriate timeout taken at that point. Vaginal instruments were position, Hulka tenaculum. Uterus itself under anesthesia was 10 week size adnexa unremarkable. The subumbilical area was infiltrated with quarter percent Marcaine plain, small incision was made in the varies needle was introduced without difficulty. Its intra-abdominal position was verified by pressure water testing. After 2-1/2 L pneumoperitoneum syncopated lap scopic trocar and sleeve were then introduced without difficulty. There was no evidence bleeding or trauma. 3 finger breaths above the symphysis in the midline a 5 mm trocar was inserted under direct visualization. The pelvic findings as follows:  The uterus itself was enlarged to 10 week size with irregular fibroids bilateral tubes and ovaries were unremarkable the cul-de-sac was free and clear. Using the in seal device the utero-ovarian pedicle on each side was coagulated and divided down to and including the round ligament. Vaginal portion the procedure started at this point.  Patient's legs were extended, weighted speculum was positioned, cervix grasped with tenaculum, cervical vaginal mucosa was incised with the Bovie, posterior culdotomy performed without difficulty. The bladder was advanced superiorly with sharp and blunt dissection. In sequential manner the uterosacral ligament, cardinal ligament and uterine vasculature pedicles were  clamped divided and suture ligated with 0 Vicryl suture. Once the dissection had extended superiorly, decision made to morcellate the uterus to allow the fundus to be delivered posteriorly portions were morcellated in the midline and to the bulk of the uterine fundus could be delivered posteriorly. The remaining anterior peritoneal reflection was then identified, remaining pedicles were clamped divided and free tie with 0 Vicryl suture. Specimen weight 300+ grams. Starting on on the right the vaginal cuff was then closed the running locked 2-0 Monocryl suture. Prior to closure sponge denies precast reported as correct x2 the vaginal mucosal closed right to left with 2-0 Monocryl interrupted sutures. This was hemostatic Foley catheter positioned at this point draining clear urine. Repeat laparoscopy was carried out at this point. Nezhat was used to irrigate and then suction to inspect the operative site noted be hemostatic. An atraumatic grasper was then used to grasp the tube and this was dissected free on the right and left with the in seal device these areas were hemostatic the tubes were thus removed and sent to pathology. Further inspection at reduced pressure revealed the operative site to be hemostatic. She had the a instruments removed, gas allowed to escape the defects were closed with 4-0 Monocryl subcuticular at the umbilicus and Dermabond at the suprapubic. She tolerated this well went to recovery room in good condition.  Dictated with [facility]  Arsen Mangione M. Garry Heater.D.

## 2014-01-27 NOTE — Anesthesia Preprocedure Evaluation (Addendum)
Anesthesia Evaluation  Patient identified by MRN, date of birth, ID band Patient awake    Reviewed: Allergy & Precautions, H&P , Patient's Chart, lab work & pertinent test results, reviewed documented beta blocker date and time   History of Anesthesia Complications Negative for: history of anesthetic complications  Airway Mallampati: II TM Distance: >3 FB Neck ROM: full    Dental   Pulmonary former smoker,  breath sounds clear to auscultation        Cardiovascular Exercise Tolerance: Good Rhythm:regular Rate:Normal     Neuro/Psych    GI/Hepatic   Endo/Other  Morbid obesity  Renal/GU      Musculoskeletal   Abdominal   Peds  Hematology   Anesthesia Other Findings Multiple sclerosis- no symptoms and no meds  Reproductive/Obstetrics                          Anesthesia Physical Anesthesia Plan  ASA: III  Anesthesia Plan: General ETT   Post-op Pain Management:    Induction:   Airway Management Planned:   Additional Equipment:   Intra-op Plan:   Post-operative Plan:   Informed Consent: I have reviewed the patients History and Physical, chart, labs and discussed the procedure including the risks, benefits and alternatives for the proposed anesthesia with the patient or authorized representative who has indicated his/her understanding and acceptance.   Dental Advisory Given  Plan Discussed with: CRNA and Surgeon  Anesthesia Plan Comments:         Anesthesia Quick Evaluation

## 2014-01-28 ENCOUNTER — Encounter (HOSPITAL_COMMUNITY): Payer: Self-pay | Admitting: Obstetrics and Gynecology

## 2014-01-28 LAB — CBC
HCT: 31.6 % — ABNORMAL LOW (ref 36.0–46.0)
Hemoglobin: 9.8 g/dL — ABNORMAL LOW (ref 12.0–15.0)
MCH: 27.9 pg (ref 26.0–34.0)
MCHC: 31 g/dL (ref 30.0–36.0)
MCV: 90 fL (ref 78.0–100.0)
Platelets: 374 10*3/uL (ref 150–400)
RBC: 3.51 MIL/uL — ABNORMAL LOW (ref 3.87–5.11)
RDW: 14.1 % (ref 11.5–15.5)
WBC: 13.1 10*3/uL — ABNORMAL HIGH (ref 4.0–10.5)

## 2014-01-28 NOTE — Progress Notes (Signed)
UR chart review completed.  

## 2014-01-28 NOTE — Progress Notes (Signed)
1 Day Post-Op Procedure(s) (LRB): LAPAROSCOPIC ASSISTED VAGINAL HYSTERECTOMY, BILATERAL SALPINGECTOMY (Bilateral)  Subjective: Patient reports tolerating PO.    Objective: I have reviewed patient's vital signs and labs.  abd soft + BS CBC    Component Value Date/Time   WBC 13.1* 01/28/2014 0525   RBC 3.51* 01/28/2014 0525   HGB 9.8* 01/28/2014 0525   HCT 31.6* 01/28/2014 0525   PLT 374 01/28/2014 0525   MCV 90.0 01/28/2014 0525   MCH 27.9 01/28/2014 0525   MCHC 31.0 01/28/2014 0525   RDW 14.1 01/28/2014 0525   LYMPHSABS 2.7 06/05/2013 1110   MONOABS 0.4 06/05/2013 1110   EOSABS 0.3 06/05/2013 1110   BASOSABS 0.0 06/05/2013 1110     Assessment: s/p Procedure(s): LAPAROSCOPIC ASSISTED VAGINAL HYSTERECTOMY, BILATERAL SALPINGECTOMY (Bilateral): stable  Plan: Advance diet Encourage ambulation Advance to PO medication Discontinue IV fluids    LOS: 1 day    Christropher Gintz M 01/28/2014, 8:23 AM

## 2014-01-29 MED ORDER — FERROUS SULFATE 325 (65 FE) MG PO TABS: 325.0000 mg | ORAL_TABLET | Freq: Every day | ORAL | Status: DC

## 2014-01-29 MED ORDER — IBUPROFEN 800 MG PO TABS: 800.0000 mg | ORAL_TABLET | Freq: Three times a day (TID) | ORAL | Status: DC | PRN

## 2014-01-29 MED ORDER — OXYCODONE-ACETAMINOPHEN 5-325 MG PO TABS
1.0000 | ORAL_TABLET | ORAL | Status: DC | PRN
Start: 2014-01-29 — End: 2014-06-05

## 2014-01-29 NOTE — Discharge Summary (Signed)
Physician Discharge Summary  Patient ID: Gabriela Stephens MRN: [id number] DOB/AGE: April 24, 1961 53 y.o.  Admit date: 01/27/2014 Discharge date: 01/29/2014  Admission Diagnoses:  Discharge Diagnoses:  Active Problems:   Leiomyoma   Discharged Condition: good  Hospital Course: admfor LAVH + bilat salpingectomy, on POD 1 , diet advanced, remained afeb, D/C on POD 2, topl PO, afeb  Consults: None  Significant Diagnostic Studies: labs:  CBC    Component Value Date/Time   WBC 13.1* 01/28/2014 0525   RBC 3.51* 01/28/2014 0525   HGB 9.8* 01/28/2014 0525   HCT 31.6* 01/28/2014 0525   PLT 374 01/28/2014 0525   MCV 90.0 01/28/2014 0525   MCH 27.9 01/28/2014 0525   MCHC 31.0 01/28/2014 0525   RDW 14.1 01/28/2014 0525   LYMPHSABS 2.7 06/05/2013 1110   MONOABS 0.4 06/05/2013 1110   EOSABS 0.3 06/05/2013 1110   BASOSABS 0.0 06/05/2013 1110      Treatments: surgery: LAVH, bilat salpingectomy  Discharge Exam: Blood pressure 130/63, pulse 82, temperature 98 F (36.7 C), temperature source Oral, resp. rate 16, height 5\' 1"  (1.549 m), weight 190 lb (86.183 kg), last menstrual period 10/17/2011, SpO2 98.00%. Incision/Wound:abd soft flat + BS  Disposition: 01-Home or Self Care     Medication List         ferrous sulfate 325 (65 FE) MG tablet  Commonly known as:  FERROUSUL  Take 1 tablet (325 mg total) by mouth daily with breakfast.     ibuprofen 800 MG tablet  Commonly known as:  ADVIL,MOTRIN  Take 1 tablet (800 mg total) by mouth every 8 (eight) hours as needed (mild pain).     Melatonin 10 MG Tabs  Take 2 tablets by mouth at bedtime.     oxyCODONE-acetaminophen 5-325 MG per tablet  Commonly known as:  PERCOCET/ROXICET  Take 1-2 tablets by mouth every 4 (four) hours as needed for severe pain (moderate to severe pain (when tolerating fluids)).           Follow-up Information   Follow up with Margarette Asal, MD. Schedule an appointment as soon as possible for a visit in  1 week.   Specialty:  Obstetrics and Gynecology   Contact information:   [address] [place] [address] [phone]       Signed: Margarette Asal 01/29/2014, 7:45 AM

## 2014-01-29 NOTE — Progress Notes (Signed)
Discharge instructions provided to patient and significant other at bedside.  Activity, medications, follow up appointments, when to call the doctor and community resources discussed.  NO questions at this time.  Patient left unit in stable condition with all personal belongings accompanied by staff in wheelchair.  Leighton Roach, RN----------------

## 2014-06-05 ENCOUNTER — Encounter: Payer: Self-pay | Admitting: Family Medicine

## 2014-06-05 ENCOUNTER — Ambulatory Visit (INDEPENDENT_AMBULATORY_CARE_PROVIDER_SITE_OTHER): Payer: BC Managed Care – PPO | Admitting: Family Medicine

## 2014-06-05 VITALS — BP 130/80 | HR 81 | Temp 98.1°F | Resp 16 | Ht 61.0 in | Wt 201.1 lb

## 2014-06-05 DIAGNOSIS — E669 Obesity, unspecified: Secondary | ICD-10-CM

## 2014-06-05 MED ORDER — PHENTERMINE HCL 37.5 MG PO CAPS: 37.5000 mg | ORAL_CAPSULE | ORAL | Status: DC

## 2014-06-05 NOTE — Patient Instructions (Signed)
Follow up in 6 weeks to recheck BP, pulse and weight loss Keep up the good work on healthy diet and regular exercise Please fax the lab results to me Call with any questions or concerns Happy Holidays!!!

## 2014-06-05 NOTE — Progress Notes (Signed)
Pre visit review using our clinic review tool, if applicable. No additional management support is needed unless otherwise documented below in the visit note. 

## 2014-06-05 NOTE — Progress Notes (Signed)
   Subjective:    Patient ID: Gabriela Stephens, female    DOB: Nov 18, 1960, 53 y.o.   MRN: [id number]  HPI Obesity- pt is interested in weight loss, 'i need a jump start on the diet'.  Pt has recently resumed exercise- now 2-3 days/week.  Has FitBit.  Pt had lapse in exercise after hysterectomy this summer.  When was at home, increased eating.  Now back to paying attention to diet.  Due to MS, pt very concerned about increased weight.     Review of Systems For ROS see HPI     Objective:   Physical Exam  Constitutional: She is oriented to person, place, and time. She appears well-developed and well-nourished. No distress.  HENT:  Head: Normocephalic and atraumatic.  Eyes: Conjunctivae and EOM are normal. Pupils are equal, round, and reactive to light.  Neck: Normal range of motion. Neck supple. No thyromegaly present.  Cardiovascular: Normal rate, regular rhythm, normal heart sounds and intact distal pulses.   No murmur heard. Pulmonary/Chest: Effort normal and breath sounds normal. No respiratory distress.  Abdominal: Soft. She exhibits no distension. There is no tenderness.  Musculoskeletal: She exhibits no edema.  Lymphadenopathy:    She has no cervical adenopathy.  Neurological: She is alert and oriented to person, place, and time.  Skin: Skin is warm and dry.  Psychiatric: She has a normal mood and affect. Her behavior is normal.  Vitals reviewed.         Assessment & Plan:

## 2014-06-08 NOTE — Assessment & Plan Note (Signed)
New.  Pt is interested in jump-starting weight loss.  Has gained weight to due to recent hysterectomy.  Not interested in long term medication.  Has resumed daily walking and attention to healthy diet.  Will start phentermine and monitor for improvement.  Reviewed possible side effects.  Will follow.

## 2014-06-19 ENCOUNTER — Encounter: Payer: Self-pay | Admitting: Family Medicine

## 2014-06-19 ENCOUNTER — Ambulatory Visit (INDEPENDENT_AMBULATORY_CARE_PROVIDER_SITE_OTHER): Payer: BC Managed Care – PPO | Admitting: Family Medicine

## 2014-06-19 VITALS — BP 130/80 | HR 82 | Temp 98.2°F | Resp 16 | Wt 195.0 lb

## 2014-06-19 DIAGNOSIS — H109 Unspecified conjunctivitis: Secondary | ICD-10-CM | POA: Insufficient documentation

## 2014-06-19 MED ORDER — POLYMYXIN B-TRIMETHOPRIM 10000-0.1 UNIT/ML-% OP SOLN: OPHTHALMIC | Status: DC

## 2014-06-19 NOTE — Patient Instructions (Signed)
Follow up as needed Use the eye drops as directed Wash your hands to avoid spreading Call with any questions or concerns Happy Holidays!!

## 2014-06-19 NOTE — Progress Notes (Signed)
   Subjective:    Patient ID: Gabriela Stephens, female    DOB: 1960/10/14, 53 y.o.   MRN: [id number]  HPI Conjunctivitis- pt was with nephew this weekend.  Woke up yesterday and L eye was red and crusted.  Today had increased redness and crusting.  No fevers.  Some gritty sensation, minimal itching, no pain.  No blurry/double vision.   Review of Systems For ROS see HPI     Objective:   Physical Exam  Constitutional: She appears well-developed and well-nourished.  Eyes: EOM are normal. Pupils are equal, round, and reactive to light. Right eye exhibits no discharge. Left eye exhibits discharge (cloudy, mucoid discharge in L eye).  L conjunctiva injected  Vitals reviewed.         Assessment & Plan:

## 2014-06-19 NOTE — Assessment & Plan Note (Signed)
New.  Pt w/ sick contact and now thick, cloudy drainage in L eye.  Start topical abx drops.  Reviewed supportive care and red flags that should prompt return.  Pt expressed understanding and is in agreement w/ plan.

## 2014-06-19 NOTE — Progress Notes (Signed)
Pre visit review using our clinic review tool, if applicable. No additional management support is needed unless otherwise documented below in the visit note. 

## 2014-07-23 ENCOUNTER — Ambulatory Visit (INDEPENDENT_AMBULATORY_CARE_PROVIDER_SITE_OTHER): Payer: BLUE CROSS/BLUE SHIELD | Admitting: Family Medicine

## 2014-07-23 ENCOUNTER — Encounter: Payer: Self-pay | Admitting: Family Medicine

## 2014-07-23 VITALS — BP 120/80 | HR 81 | Temp 98.2°F | Resp 16 | Ht 61.75 in | Wt 191.5 lb

## 2014-07-23 DIAGNOSIS — E669 Obesity, unspecified: Secondary | ICD-10-CM

## 2014-07-23 NOTE — Progress Notes (Signed)
   Subjective:    Patient ID: Gabriela Stephens, female    DOB: 1961-01-13, 54 y.o.   MRN: [id number]  HPI Obesity- pt started Phentermine at last visit.  Initially had palpitations but those resolved.  Now only having rapid HR w/ exercise.  No CP, SOB, HAs, visual changes, insomnia.   Review of Systems For ROS see HPI     Objective:   Physical Exam  Constitutional: She is oriented to person, place, and time. She appears well-developed and well-nourished. No distress.  HENT:  Head: Normocephalic and atraumatic.  Eyes: Conjunctivae and EOM are normal. Pupils are equal, round, and reactive to light.  Neck: Normal range of motion. Neck supple. Thyromegaly present.  Cardiovascular: Normal rate, regular rhythm, normal heart sounds and intact distal pulses.   No murmur heard. Pulmonary/Chest: Effort normal and breath sounds normal. No respiratory distress.  Musculoskeletal: She exhibits no edema.  Lymphadenopathy:    She has no cervical adenopathy.  Neurological: She is alert and oriented to person, place, and time.  Skin: Skin is warm and dry.  Psychiatric: She has a normal mood and affect. Her behavior is normal.  Vitals reviewed.         Assessment & Plan:

## 2014-07-23 NOTE — Progress Notes (Signed)
Pre visit review using our clinic review tool, if applicable. No additional management support is needed unless otherwise documented below in the visit note. 

## 2014-07-23 NOTE — Patient Instructions (Signed)
Schedule your complete physical in 3-4 months Keep up the good work!  You look great! Call with any questions or concerns Happy New Year!!!

## 2014-07-23 NOTE — Assessment & Plan Note (Signed)
Pt is doing well on Phentermine.  Had some initial palpitations but these improved after 1 week.  Exercising regularly, trying to eat better.  No elevation in BP.  Pt to continue meds as directed.  No changes at this time.

## 2014-11-06 ENCOUNTER — Other Ambulatory Visit: Payer: Self-pay | Admitting: Family Medicine

## 2014-11-06 NOTE — Telephone Encounter (Signed)
Last OV 07-23-14, pt advised to make a CPE in 3-4 months (no appt scheduled) Phentermine last filled 06-05-14 #30 with 3

## 2014-11-06 NOTE — Telephone Encounter (Signed)
Needs CPE.  Will refill for #30, no additional

## 2014-11-06 NOTE — Telephone Encounter (Signed)
Med filled and faxed.  

## 2015-03-06 ENCOUNTER — Other Ambulatory Visit: Payer: Self-pay | Admitting: Family Medicine

## 2015-03-06 NOTE — Telephone Encounter (Signed)
Last OV 07/23/14 Phentermine last filled 11/06/14 #30 with 0

## 2015-03-06 NOTE — Telephone Encounter (Signed)
Medication filled to pharmacy as requested.   

## 2015-07-07 ENCOUNTER — Other Ambulatory Visit: Payer: Self-pay | Admitting: Family Medicine

## 2015-07-07 NOTE — Telephone Encounter (Signed)
Last OV 07/23/14 Phentermine last filled 03/06/15 #30 with 0

## 2015-07-08 NOTE — Telephone Encounter (Signed)
Medication filled to pharmacy as requested.   

## 2015-12-09 DIAGNOSIS — G8918 Other acute postprocedural pain: Secondary | ICD-10-CM | POA: Diagnosis not present

## 2015-12-09 DIAGNOSIS — S83232A Complex tear of medial meniscus, current injury, left knee, initial encounter: Secondary | ICD-10-CM | POA: Diagnosis not present

## 2015-12-09 DIAGNOSIS — M94262 Chondromalacia, left knee: Secondary | ICD-10-CM | POA: Diagnosis not present

## 2015-12-09 DIAGNOSIS — S83242A Other tear of medial meniscus, current injury, left knee, initial encounter: Secondary | ICD-10-CM | POA: Diagnosis not present

## 2015-12-15 DIAGNOSIS — M23322 Other meniscus derangements, posterior horn of medial meniscus, left knee: Secondary | ICD-10-CM | POA: Diagnosis not present

## 2015-12-15 DIAGNOSIS — S83242D Other tear of medial meniscus, current injury, left knee, subsequent encounter: Secondary | ICD-10-CM | POA: Diagnosis not present

## 2015-12-15 DIAGNOSIS — M25562 Pain in left knee: Secondary | ICD-10-CM | POA: Diagnosis not present

## 2015-12-15 DIAGNOSIS — M25662 Stiffness of left knee, not elsewhere classified: Secondary | ICD-10-CM | POA: Diagnosis not present

## 2015-12-17 DIAGNOSIS — M25562 Pain in left knee: Secondary | ICD-10-CM | POA: Diagnosis not present

## 2015-12-17 DIAGNOSIS — M25662 Stiffness of left knee, not elsewhere classified: Secondary | ICD-10-CM | POA: Diagnosis not present

## 2015-12-17 DIAGNOSIS — M23322 Other meniscus derangements, posterior horn of medial meniscus, left knee: Secondary | ICD-10-CM | POA: Diagnosis not present

## 2015-12-22 DIAGNOSIS — M25662 Stiffness of left knee, not elsewhere classified: Secondary | ICD-10-CM | POA: Diagnosis not present

## 2015-12-22 DIAGNOSIS — M25562 Pain in left knee: Secondary | ICD-10-CM | POA: Diagnosis not present

## 2015-12-22 DIAGNOSIS — M23322 Other meniscus derangements, posterior horn of medial meniscus, left knee: Secondary | ICD-10-CM | POA: Diagnosis not present

## 2015-12-24 DIAGNOSIS — M23322 Other meniscus derangements, posterior horn of medial meniscus, left knee: Secondary | ICD-10-CM | POA: Diagnosis not present

## 2015-12-24 DIAGNOSIS — M25562 Pain in left knee: Secondary | ICD-10-CM | POA: Diagnosis not present

## 2015-12-24 DIAGNOSIS — M25662 Stiffness of left knee, not elsewhere classified: Secondary | ICD-10-CM | POA: Diagnosis not present

## 2015-12-29 DIAGNOSIS — M25562 Pain in left knee: Secondary | ICD-10-CM | POA: Diagnosis not present

## 2015-12-29 DIAGNOSIS — M23322 Other meniscus derangements, posterior horn of medial meniscus, left knee: Secondary | ICD-10-CM | POA: Diagnosis not present

## 2015-12-29 DIAGNOSIS — M25662 Stiffness of left knee, not elsewhere classified: Secondary | ICD-10-CM | POA: Diagnosis not present

## 2015-12-29 DIAGNOSIS — S83242D Other tear of medial meniscus, current injury, left knee, subsequent encounter: Secondary | ICD-10-CM | POA: Diagnosis not present

## 2015-12-31 DIAGNOSIS — M25562 Pain in left knee: Secondary | ICD-10-CM | POA: Diagnosis not present

## 2015-12-31 DIAGNOSIS — M23322 Other meniscus derangements, posterior horn of medial meniscus, left knee: Secondary | ICD-10-CM | POA: Diagnosis not present

## 2015-12-31 DIAGNOSIS — M25662 Stiffness of left knee, not elsewhere classified: Secondary | ICD-10-CM | POA: Diagnosis not present

## 2016-01-12 DIAGNOSIS — M25662 Stiffness of left knee, not elsewhere classified: Secondary | ICD-10-CM | POA: Diagnosis not present

## 2016-01-12 DIAGNOSIS — M25562 Pain in left knee: Secondary | ICD-10-CM | POA: Diagnosis not present

## 2016-01-12 DIAGNOSIS — M23322 Other meniscus derangements, posterior horn of medial meniscus, left knee: Secondary | ICD-10-CM | POA: Diagnosis not present

## 2016-01-20 DIAGNOSIS — M25662 Stiffness of left knee, not elsewhere classified: Secondary | ICD-10-CM | POA: Diagnosis not present

## 2016-01-20 DIAGNOSIS — M23322 Other meniscus derangements, posterior horn of medial meniscus, left knee: Secondary | ICD-10-CM | POA: Diagnosis not present

## 2016-01-20 DIAGNOSIS — M25562 Pain in left knee: Secondary | ICD-10-CM | POA: Diagnosis not present

## 2016-05-19 DIAGNOSIS — M23322 Other meniscus derangements, posterior horn of medial meniscus, left knee: Secondary | ICD-10-CM | POA: Diagnosis not present

## 2016-07-19 DIAGNOSIS — Z1231 Encounter for screening mammogram for malignant neoplasm of breast: Secondary | ICD-10-CM | POA: Diagnosis not present

## 2016-07-19 DIAGNOSIS — Z01419 Encounter for gynecological examination (general) (routine) without abnormal findings: Secondary | ICD-10-CM | POA: Diagnosis not present

## 2016-07-19 DIAGNOSIS — Z6838 Body mass index (BMI) 38.0-38.9, adult: Secondary | ICD-10-CM | POA: Diagnosis not present

## 2016-08-11 LAB — HM DEXA SCAN: HM Dexa Scan: NORMAL

## 2016-08-11 LAB — HM PAP SMEAR

## 2016-08-11 LAB — HM MAMMOGRAPHY: HM Mammogram: NORMAL (ref 0–4)

## 2016-10-23 DIAGNOSIS — H524 Presbyopia: Secondary | ICD-10-CM | POA: Diagnosis not present

## 2017-04-12 LAB — HEPATIC FUNCTION PANEL
ALT: 13 (ref 7–35)
AST: 17 (ref 13–35)
Alkaline Phosphatase: 117 (ref 25–125)

## 2017-04-12 LAB — LIPID PANEL
Cholesterol: 189 (ref 0–200)
HDL: 59 (ref 35–70)
LDL Cholesterol: 117
LDl/HDL Ratio: 3.2
Triglycerides: 65 (ref 40–160)

## 2017-04-12 LAB — BASIC METABOLIC PANEL
BUN: 12 (ref 4–21)
Creatinine: 0.7 (ref 0.5–1.1)
Glucose: 83

## 2017-05-11 ENCOUNTER — Ambulatory Visit (INDEPENDENT_AMBULATORY_CARE_PROVIDER_SITE_OTHER): Payer: BLUE CROSS/BLUE SHIELD | Admitting: Family Medicine

## 2017-05-11 ENCOUNTER — Encounter: Payer: Self-pay | Admitting: Family Medicine

## 2017-05-11 VITALS — BP 123/80 | HR 71 | Temp 98.3°F | Resp 16 | Ht 62.0 in | Wt 210.1 lb

## 2017-05-11 DIAGNOSIS — E669 Obesity, unspecified: Secondary | ICD-10-CM | POA: Diagnosis not present

## 2017-05-11 DIAGNOSIS — G35 Multiple sclerosis: Secondary | ICD-10-CM | POA: Diagnosis not present

## 2017-05-11 NOTE — Assessment & Plan Note (Signed)
New to provider, ongoing for pt.  She has not seen a neurologist in years.  Called neurology and they reported she needed a referral.  Referral placed.

## 2017-05-11 NOTE — Patient Instructions (Signed)
Schedule your complete physical in 6 months We'll call you with your Neuro appt Please fax or email your recent labs to [place] so we can update your chart Please have GYN send me a copy of their note after your visit this winter Call with any questions or concerns Happy Fall!!!

## 2017-05-11 NOTE — Assessment & Plan Note (Signed)
Deteriorated.  Pt has gained nearly 20 lbs since last visit 2 yrs ago.  Stressed need for healthy diet and regular exercise.  Pt reports she had all of her labs done recently at work and will bring those for [place] to review.  Will follow.

## 2017-05-11 NOTE — Progress Notes (Signed)
   Subjective:    Patient ID: Gabriela Stephens, female    DOB: April 06, 1961, 56 y.o.   MRN: [id number]  HPI MS- pt has not been following w/ anyone recently.  She is having 'weird sensations all over my body'.  Has not had an MS 'attack' since 1998.  Now having tingling in hands and feet.  Sensation traveling up R arm.  No falls.  Some dizziness, weakness, visual changes.  sxs started ~1 month ago  Obesity- pt has gained nearly 20 lbs since last visit >2 yrs ago.  Pt reports she had health assessment at work on 9/26 and 'everything was perfect- LD, HDL, sugar, all of it'.   Review of Systems For ROS see HPI     Objective:   Physical Exam  Constitutional: She is oriented to person, place, and time. She appears well-developed and well-nourished. No distress.  obese  HENT:  Head: Normocephalic and atraumatic.  Eyes: Pupils are equal, round, and reactive to light. Conjunctivae and EOM are normal.  Neck: Normal range of motion. Neck supple. No thyromegaly present.  Cardiovascular: Normal rate, regular rhythm, normal heart sounds and intact distal pulses.   No murmur heard. Pulmonary/Chest: Effort normal and breath sounds normal. No respiratory distress.  Abdominal: Soft. She exhibits no distension. There is no tenderness.  Musculoskeletal: She exhibits no edema.  Lymphadenopathy:    She has no cervical adenopathy.  Neurological: She is alert and oriented to person, place, and time. She has normal reflexes. No cranial nerve deficit. Coordination normal.  Skin: Skin is warm and dry.  Psychiatric: She has a normal mood and affect. Her behavior is normal.  Vitals reviewed.         Assessment & Plan:

## 2017-05-16 ENCOUNTER — Encounter: Payer: Self-pay | Admitting: Neurology

## 2017-05-19 ENCOUNTER — Other Ambulatory Visit: Payer: BLUE CROSS/BLUE SHIELD

## 2017-05-19 ENCOUNTER — Encounter: Payer: Self-pay | Admitting: General Practice

## 2017-05-19 ENCOUNTER — Encounter: Payer: Self-pay | Admitting: Neurology

## 2017-05-19 ENCOUNTER — Ambulatory Visit (INDEPENDENT_AMBULATORY_CARE_PROVIDER_SITE_OTHER): Payer: BLUE CROSS/BLUE SHIELD | Admitting: Neurology

## 2017-05-19 VITALS — BP 158/78 | HR 90 | Ht 61.0 in | Wt 210.0 lb

## 2017-05-19 DIAGNOSIS — G35 Multiple sclerosis: Secondary | ICD-10-CM

## 2017-05-19 DIAGNOSIS — R030 Elevated blood-pressure reading, without diagnosis of hypertension: Secondary | ICD-10-CM | POA: Diagnosis not present

## 2017-05-19 LAB — HEPATIC FUNCTION PANEL
AG Ratio: 1.5 (calc) (ref 1.0–2.5)
ALT: 15 U/L (ref 6–29)
AST: 19 U/L (ref 10–35)
Albumin: 4.4 g/dL (ref 3.6–5.1)
Alkaline phosphatase (APISO): 109 U/L (ref 33–130)
Bilirubin, Direct: 0.1 mg/dL (ref 0.0–0.2)
Globulin: 2.9 g/dL (calc) (ref 1.9–3.7)
Indirect Bilirubin: 0.1 mg/dL (calc) — ABNORMAL LOW (ref 0.2–1.2)
Total Bilirubin: 0.2 mg/dL (ref 0.2–1.2)
Total Protein: 7.3 g/dL (ref 6.1–8.1)

## 2017-05-19 NOTE — Progress Notes (Signed)
NEUROLOGY CONSULTATION NOTE  [name] MRN: [id number] DOB: 05-Jun-1961  Referring provider: Dr. Birdie Stephens Primary care provider: Dr. Birdie Stephens  Reason for consult:  Multiple sclerosis  HISTORY OF PRESENT ILLNESS: Gabriela Stephens is a 55 year old right-handed African American female who presents to establish care regarding multiple sclerosis.   She was diagnosed with multiple sclerosis in 1993, when she had her first flare, presenting as right sided numbness and weakness.  MRI of brain and cervical spine showed lesions.  She may have had a lumbar puncture.  She was treated with IV steroids in the hospital.  Symptoms lasted about 3 months.  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  Symptoms lasted 2 to 3 weeks.  She was again treated with steroids.  She had a second relapse in 1998 in which she had a recurrence of left optic neuritis and numbness and tingling of the extremities.  She was again treated with steroids.  It was recommended that she start Avonex but she deferred.  She was never on disease modifying therapy.  Over the years, she would have occasional periods of lethargy in which she would remain in bed for a few days.  Otherwise, she has not had any other chronic symptoms.  She denies bladder dysfunction, chronic pain or memory deficits.  About 2 months ago, she began experiencing some numbness, aches and heaviness in her right arm.  She also has been experiencing numbness and tingling in the hands and feet.  Her balance is also off.  These are symptoms concerning for possible relapse.  Symptoms are unchanged and have not progressed or improved.  Family history:  Paternal second cousin has MS.  Sister has RA.  Father had ALS.  Mother had Alzheimer's disease.   PAST MEDICAL HISTORY: Past Medical History:  Diagnosis Date  . Multiple sclerosis ([place])     PAST SURGICAL HISTORY: Past Surgical History:  Procedure  Laterality Date  . KNEE CARTILAGE SURGERY    . LAPAROSCOPIC ASSISTED VAGINAL HYSTERECTOMY Bilateral 01/27/2014   Procedure: LAPAROSCOPIC ASSISTED VAGINAL HYSTERECTOMY, BILATERAL SALPINGECTOMY;  Surgeon: Gabriela Asal, MD;  Location: [facility];  Service: Gynecology;  Laterality: Bilateral;  . TUBAL LIGATION  1986    MEDICATIONS: Current Outpatient Prescriptions on File Prior to Visit  Medication Sig Dispense Refill  . DUEXIS 800-26.6 MG TABS Take 1 tablet by mouth every 8 (eight) hours as needed.  1  . Ibuprofen-Diphenhydramine Cit (ADVIL PM PO) Take by mouth.     No current facility-administered medications on file prior to visit.     ALLERGIES: No Known Allergies  FAMILY HISTORY: History reviewed. No pertinent family history.  SOCIAL HISTORY: Social History   Social History  . Marital status: Married    Spouse name: N/A  . Number of children: N/A  . Years of education: N/A   Occupational History  . Not on file.   Social History Main Topics  . Smoking status: Former Research scientist (life sciences)  . Smokeless tobacco: Never Used  . Alcohol use Yes     Comment: occasional wine  . Drug use: No  . Sexual activity: Yes    Birth control/ protection: Post-menopausal   Other Topics Concern  . Not on file   Social History Narrative  . No narrative on file    REVIEW OF SYSTEMS: Constitutional: No fevers, chills, or sweats, no generalized fatigue, change in appetite Eyes: No visual changes, double vision, eye  pain Ear, nose and throat: No hearing loss, ear pain, nasal congestion, sore throat Cardiovascular: No chest pain, palpitations Respiratory:  No shortness of breath at rest or with exertion, wheezes GastrointestinaI: No nausea, vomiting, diarrhea, abdominal pain, fecal incontinence Genitourinary:  No dysuria, urinary retention or frequency Musculoskeletal:  No neck pain, back pain Integumentary: No rash, pruritus, skin lesions Neurological: as above Psychiatric: No depression,  insomnia, anxiety Endocrine: No palpitations, fatigue, diaphoresis, mood swings, change in appetite, change in weight, increased thirst Hematologic/Lymphatic:  No purpura, petechiae. Allergic/Immunologic: no itchy/runny eyes, nasal congestion, recent allergic reactions, rashes  PHYSICAL EXAM: Vitals:   05/19/17 1505  BP: (!) 158/78  Pulse: 90  SpO2: 98%   General: No acute distress.  Patient appears well-groomed.  Head:  Normocephalic/atraumatic Eyes:  fundi examined but not visualized Neck: supple, no paraspinal tenderness, full range of motion Back: No paraspinal tenderness Heart: regular rate and rhythm Lungs: Clear to auscultation bilaterally. Vascular: No carotid bruits. Neurological Exam: Mental status: alert and oriented to person, place, and time, recent and remote memory intact, fund of knowledge intact, attention and concentration intact, speech fluent and not dysarthric, language intact. Cranial nerves: CN I: not tested CN II: pupils equal, round and reactive to light, visual fields intact CN III, IV, VI:  full range of motion, no nystagmus, no ptosis CN V: facial sensation intact CN VII: upper and lower face symmetric CN VIII: hearing intact CN IX, X: gag intact, uvula midline CN XI: sternocleidomastoid and trapezius muscles intact CN XII: tongue midline Bulk & Tone: normal, no fasciculations. Motor:  5/5 throughout  Sensation:  Pinprick and vibration sensation intact. Deep Tendon Reflexes:  2+ throughout, toes downgoing.  Finger to nose testing:  Without dysmetria.  Heel to shin:  Without dysmetria.  Gait:  Mildly wide-based station and stride..  Able to turn but difficulty with tandem walk. Timed 25 foot walk 7.36 seconds.  Romberg negative.  IMPRESSION: Relapsing remitting multiple sclerosis Elevated blood pressure.  No history of HTN.  Possibly feeling nervous.  PLAN: 1.  We will check MRI of brain and cervical spine with and without contrast 2.  We will  check CBC with diff and vitamin D level 3.  We reviewed the various available MS disease modifying therapies, discussing side effects.  She will review the literature and contact me with her choice.   4.  Follow up blood pressure with PCP. 5.  Follow up in 6 months.  45 minutes spent face to face with patient, over 50% spent discussing disease modifying therapy and plan.  Thank you for allowing me to take part in the care of this patient.  Metta Clines, DO  CC:  Annye Asa, MD

## 2017-05-19 NOTE — Patient Instructions (Signed)
1.  Please review the available MS medications and contact me with your choice. 2.  We will check MRI of brain and cervical spine with and without contrast 3.  We will check CBC with diff and vitamin D level 4.  Follow up in 6 months.

## 2017-05-22 ENCOUNTER — Encounter: Payer: Self-pay | Admitting: Neurology

## 2017-05-30 ENCOUNTER — Telehealth: Payer: Self-pay | Admitting: Neurology

## 2017-05-30 NOTE — Telephone Encounter (Signed)
Patient was checking the status of her MRI Referral? Please call her at work. Thanks

## 2017-05-30 NOTE — Telephone Encounter (Signed)
LM on VM for Pt to rtrn my call

## 2017-06-06 NOTE — Telephone Encounter (Signed)
Called Pt to check if MRI has been scheduled. Is scheduled for this Sunday 06/11/17

## 2017-06-11 ENCOUNTER — Ambulatory Visit
Admission: RE | Admit: 2017-06-11 | Discharge: 2017-06-11 | Disposition: A | Discharge status: Home / Self Care | Payer: BLUE CROSS/BLUE SHIELD | Source: Ambulatory Visit | Attending: Neurology | Admitting: Neurology

## 2017-06-11 DIAGNOSIS — G35 Multiple sclerosis: Secondary | ICD-10-CM | POA: Diagnosis not present

## 2017-06-11 DIAGNOSIS — M47812 Spondylosis without myelopathy or radiculopathy, cervical region: Secondary | ICD-10-CM | POA: Diagnosis not present

## 2017-06-11 MED ORDER — GADOBENATE DIMEGLUMINE 529 MG/ML IV SOLN
20.0000 mL | Freq: Once | INTRAVENOUS | Status: AC | PRN
  Administered 2017-06-11: 20 mL via INTRAVENOUS

## 2017-06-12 ENCOUNTER — Telehealth: Payer: Self-pay | Admitting: Neurology

## 2017-06-12 ENCOUNTER — Telehealth: Payer: Self-pay

## 2017-06-12 ENCOUNTER — Telehealth: Payer: Self-pay | Admitting: *Deleted

## 2017-06-12 MED ORDER — AMOXICILLIN 875 MG PO TABS: 875.0000 mg | ORAL_TABLET | Freq: Two times a day (BID) | ORAL | 0 refills | Status: AC

## 2017-06-12 NOTE — Telephone Encounter (Signed)
Returned Pts call, LM on VM for her to call me back for MRI results

## 2017-06-12 NOTE — Telephone Encounter (Signed)
-----   Message from Pieter Partridge, DO sent at 06/12/2017  7:08 AM EST ----- MRI reveals old MS lesions but nothing acute.  Incidentally, the brain MRI reveals fluid in the sinuses.  If she is experiencing any sinus congestion/headache/fever, she may want to follow up with her PCP to evaluate for sinusitis.  Otherwise, it is not anything concerning.

## 2017-06-12 NOTE — Telephone Encounter (Signed)
Based on MRI results (which I reviewed) and her current sinus headaches and congestion, will start AMOXICILLIN 875mg , BID x10 days, #20, no refills.  If no improvement after medication, will need OV

## 2017-06-12 NOTE — Telephone Encounter (Signed)
Scan was done by Neuro -  She was told she had fluid in her sinus cavity and to call PCP.   She states that she does have congestion and sinus headaches.  She is wanting to know what she needs to do.  Results from the scan are in Epic.   Copied from [place]. Topic: Quick Communication - See Telephone Encounter >> Jun 12, 2017 10:29 AM Hewitt Shorts wrote: CRM for notification. See Telephone encounter for: is calling stating that she had a scan over the weekend and was diagnosed with sinusitits and needs to know what to do next  Best number [phone]  06/12/17.

## 2017-06-12 NOTE — Telephone Encounter (Signed)
Medication has been sent to the pharmacy that patient requested.  She is aware that she will need an OV if this does not clear things up.

## 2017-06-12 NOTE — Telephone Encounter (Signed)
Patient Lmom that she was able to see her MRI results on My Chart. She would like Dr. Tomi Likens to explain them to her please. Please Call. Thanks

## 2017-06-12 NOTE — Telephone Encounter (Signed)
Spoke with Pt, advsd her of MRI results. She will contact PCP concerning her sinus headaches.

## 2017-07-27 DIAGNOSIS — Z6841 Body Mass Index (BMI) 40.0 and over, adult: Secondary | ICD-10-CM | POA: Diagnosis not present

## 2017-07-27 DIAGNOSIS — Z01419 Encounter for gynecological examination (general) (routine) without abnormal findings: Secondary | ICD-10-CM | POA: Diagnosis not present

## 2017-07-27 DIAGNOSIS — Z13820 Encounter for screening for osteoporosis: Secondary | ICD-10-CM | POA: Diagnosis not present

## 2017-07-27 DIAGNOSIS — Z1231 Encounter for screening mammogram for malignant neoplasm of breast: Secondary | ICD-10-CM | POA: Diagnosis not present

## 2017-08-01 DIAGNOSIS — H43813 Vitreous degeneration, bilateral: Secondary | ICD-10-CM | POA: Diagnosis not present

## 2017-08-01 DIAGNOSIS — G35 Multiple sclerosis: Secondary | ICD-10-CM | POA: Diagnosis not present

## 2017-08-25 ENCOUNTER — Ambulatory Visit: Payer: BLUE CROSS/BLUE SHIELD | Admitting: Neurology

## 2017-09-26 DIAGNOSIS — G35 Multiple sclerosis: Secondary | ICD-10-CM | POA: Diagnosis not present

## 2017-09-26 DIAGNOSIS — H43813 Vitreous degeneration, bilateral: Secondary | ICD-10-CM | POA: Diagnosis not present

## 2017-10-31 ENCOUNTER — Encounter: Payer: BLUE CROSS/BLUE SHIELD | Admitting: Family Medicine

## 2017-11-17 ENCOUNTER — Other Ambulatory Visit: Payer: Self-pay

## 2017-11-17 ENCOUNTER — Encounter: Payer: Self-pay | Admitting: Neurology

## 2017-11-17 ENCOUNTER — Ambulatory Visit (INDEPENDENT_AMBULATORY_CARE_PROVIDER_SITE_OTHER): Payer: BLUE CROSS/BLUE SHIELD | Admitting: Neurology

## 2017-11-17 ENCOUNTER — Other Ambulatory Visit: Payer: BLUE CROSS/BLUE SHIELD

## 2017-11-17 VITALS — BP 136/74 | HR 73 | Ht 61.0 in | Wt 210.0 lb

## 2017-11-17 DIAGNOSIS — G35 Multiple sclerosis: Secondary | ICD-10-CM

## 2017-11-17 NOTE — Progress Notes (Signed)
NEUROLOGY FOLLOW UP OFFICE NOTE  Gabriela Stephens [id number]  HISTORY OF PRESENT ILLNESS: Gabriela Stephens is a 57 year old right-handed [place] American female who follows up for multiple sclerosis.   UPDATE: Last visit, we discussed various disease modifying therapy and she was advised to contact us with decision to start one.  Due to concerns of potential side effects, she decided not to start one.  MRI of brain with and without contrast from 06/11/17 was personally reviewed and demonstrated multiple T2 FLAIR  hyperintense lesions in the cerebral/periventricular and pericallosal white matter without enhancement, as well as single lesion in the right cerebellum.  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  MRI of cervical spine with and without contrast demonstrated nonenhancing left posterolateral cord lesion at C6 level.   She is feeling well.  She had one unexplained fall in early April but otherwise no new issues.  Vision:  She noted some floaters.  Eye exam was normal. Motor:  No issue Sensory:  No issue Pain:  No Gait:  good Bowel/bladder:  No issue Fatigue:  no Cognition:  good Mood:  good  HISTORY:  She was diagnosed with multiple sclerosis in 1993, when she had her first flare, presenting as right sided numbness and weakness.  MRI of brain and cervical spine showed lesions.  She may have had a lumbar puncture.  She was treated with IV steroids in the hospital.  Symptoms lasted about 3 months.  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  Symptoms lasted 2 to 3 weeks.  She was again treated with steroids.  She had a second relapse in 1998 in which she had a recurrence of left optic neuritis and numbness and tingling of the extremities.  She was again treated with steroids.  It was recommended that she start Avonex but she deferred.  She was  never on disease modifying therapy.  Over the years, she would have occasional periods of lethargy in which she would remain in bed for a few days.  Otherwise, she has not had any other chronic symptoms.  She denies bladder dysfunction, chronic pain or memory deficits.   In September 2018, she began experiencing some numbness, aches and heaviness in her right arm.  She also has been experiencing numbness and tingling in the hands and feet.  Her balance is also off.  These are symptoms concerning for possible relapse.  Symptoms are unchanged and have not progressed or improved.   Family history:  Paternal second cousin has MS.  Sister has RA.  Father had ALS.  Mother had Alzheimer's disease.  PAST MEDICAL HISTORY: Past Medical History:  Diagnosis Date  . Multiple sclerosis ([place])     MEDICATIONS: Current Outpatient Medications on File Prior to Visit  Medication Sig Dispense Refill  . DUEXIS 800-26.6 MG TABS Take 1 tablet by mouth every 8 (eight) hours as needed.  1  . Ibuprofen-Diphenhydramine Cit (ADVIL PM PO) Take by mouth.     No current facility-administered medications on file prior to visit.     ALLERGIES: No Known Allergies  FAMILY HISTORY: No significant history.  SOCIAL HISTORY: Social History   Socioeconomic History  . Marital status: Married    Spouse name: Not on file  . Number of children: Not on file  . Years of education: Not on file  . Highest education level: Not on file  Occupational History  . Not on file  Social Needs  . Financial resource strain: Not on file  . Food insecurity:    Worry: Not on file    Inability: Not on file  . Transportation needs:    Medical: Not on file    Non-medical: Not on file  Tobacco Use  . Smoking status: Former Research scientist (life sciences)  . Smokeless tobacco: Never Used  Substance and Sexual Activity  . Alcohol use: Yes    Comment: occasional wine  . Drug use: No  . Sexual activity: Yes    Birth control/protection: Post-menopausal    Lifestyle  . Physical activity:    Days per week: Not on file    Minutes per session: Not on file  . Stress: Not on file  Relationships  . Social connections:    Talks on phone: Not on file    Gets together: Not on file    Attends religious service: Not on file    Active member of club or organization: Not on file    Attends meetings of clubs or organizations: Not on file    Relationship status: Not on file  . Intimate partner violence:    Fear of current or ex partner: Not on file    Emotionally abused: Not on file    Physically abused: Not on file    Forced sexual activity: Not on file  Other Topics Concern  . Not on file  Social History Narrative  . Not on file    REVIEW OF SYSTEMS: Constitutional: No fevers, chills, or sweats, no generalized fatigue, change in appetite Eyes: No visual changes, double vision, eye pain Ear, nose and throat: No hearing loss, ear pain, nasal congestion, sore throat Cardiovascular: No chest pain, palpitations Respiratory:  No shortness of breath at rest or with exertion, wheezes GastrointestinaI: No nausea, vomiting, diarrhea, abdominal pain, fecal incontinence Genitourinary:  No dysuria, urinary retention or frequency Musculoskeletal:  No neck pain, back pain Integumentary: No rash, pruritus, skin lesions Neurological: as above Psychiatric: No depression, insomnia, anxiety Endocrine: No palpitations, fatigue, diaphoresis, mood swings, change in appetite, change in weight, increased thirst Hematologic/Lymphatic:  No purpura, petechiae. Allergic/Immunologic: no itchy/runny eyes, nasal congestion, recent allergic reactions, rashes  PHYSICAL EXAM: Vitals:   11/17/17 0907  BP: 136/74  Pulse: 73  SpO2: 95%   General: No acute distress.  Patient appears well-groomed.   Head:  Normocephalic/atraumatic Eyes:  Fundi examined but not visualized Neck: supple, no paraspinal tenderness, full range of motion Heart:  Regular rate and  rhythm Lungs:  Clear to auscultation bilaterally Back: No paraspinal tenderness Neurological Exam: alert and oriented to person, place, and time. Attention span and concentration intact, recent and remote memory intact, fund of knowledge intact.  Speech fluent and not dysarthric, language intact.  CN II-XII intact. Bulk and tone normal, muscle strength 5/5 throughout.  Sensation to light touch, temperature and vibration intact.  Deep tendon reflexes 2+ throughout, toes downgoing.  Finger to nose and heel to shin testing intact.  Gait normal.  Able to turn and tandem walk.  Timed 25 foot walk 5.76 seconds.  Romberg negative.  IMPRESSION: Multiple sclerosis.  At this time, she defers DMT.  PLAN: 1.  We will check vitamin D level 2.  She will have repeat MRI of brain and cervical spine w/wo contrast in 6 months to evaluate for any disease progression. 3.  Follow up after repeat testing.  25 minutes spent face to face with patient, over 50% spent  discussing management.  Metta Clines, DO  CC:  Annye Asa, MD

## 2017-11-17 NOTE — Patient Instructions (Addendum)
1.  We will check a vitamin D level  Your provider requests that you have LABS drawn today.  We share a lab with [facility] - they are located in [address] (second floor) of this building.  Once you get there, please have a seat and the phlebotomist will call your name.  If you have waited more than 15 minutes, please advise the front desk    2.  We will repeat MRI of brain and cervical spine with and without contrast in 6 months.  We have sent a referral to [facility] for your MRI and they will call you directly to schedule your appt. They are located at [place]. If you need to contact them directly please call [phone].   3.  Follow up with me after repeat MRI

## 2017-11-21 ENCOUNTER — Telehealth: Payer: Self-pay | Admitting: Neurology

## 2017-11-21 LAB — VITAMIN D 1,25 DIHYDROXY
Vitamin D 1, 25 (OH)2 Total: 57 pg/mL (ref 18–72)
Vitamin D2 1, 25 (OH)2: <8 pg/mL
Vitamin D3 1, 25 (OH)2: 57 pg/mL

## 2017-11-21 NOTE — Telephone Encounter (Signed)
Patient called to check test results. Please Call. Thanks

## 2017-11-22 NOTE — Telephone Encounter (Signed)
Called LMOVM, returning call to Pt

## 2017-12-01 NOTE — Telephone Encounter (Signed)
Called and LMOVM, advising Pt the only test result was for Vit D, which was normal. Advised to call back with any questions

## 2018-05-22 ENCOUNTER — Other Ambulatory Visit: Payer: BLUE CROSS/BLUE SHIELD

## 2018-06-05 ENCOUNTER — Ambulatory Visit
Admission: RE | Admit: 2018-06-05 | Discharge: 2018-06-05 | Disposition: A | Discharge status: Home / Self Care | Payer: BLUE CROSS/BLUE SHIELD | Source: Ambulatory Visit | Attending: Neurology | Admitting: Neurology

## 2018-06-05 ENCOUNTER — Other Ambulatory Visit: Payer: Self-pay | Admitting: Neurology

## 2018-06-05 DIAGNOSIS — G35 Multiple sclerosis: Secondary | ICD-10-CM

## 2018-06-05 MED ORDER — GADOBENATE DIMEGLUMINE 529 MG/ML IV SOLN
19.0000 mL | Freq: Once | INTRAVENOUS | Status: AC | PRN
  Administered 2018-06-05: 19 mL via INTRAVENOUS

## 2018-06-05 NOTE — Progress Notes (Signed)
Patient ID: Gabriela Stephens, female   DOB: 06/03/1961, 57 y.o.   MRN: [id number]  Radiology event note  Upon receiving IV contrast patient experienced an unwell feeling including anxiety and chest pain.  She was removed from the scanner and observed.  Vitals were normal except for hypertension (up to 170/90) which improved with time.  No itching, rash, or other allergic phenomenon.  Post contrast imaging was not acquired.    The patient was released to home once she was felt back to functional baseline.  JWatts MD

## 2018-06-06 ENCOUNTER — Ambulatory Visit: Payer: Self-pay

## 2018-06-06 NOTE — Telephone Encounter (Signed)
Returned call to patient who states that yesterday while having a MRI she became short of breath and her BP spiked to 240/190. She states everything was fine until they injected the contrast. She stated that she felt like she could not get a breath. They moved her to recovery room and continued to monitor her. BP remained high for several hours. 170/110 160/100. Today she had the nurse at her church check her pressure and it was 160/100. She states that she has a headache today.  She has never had HTN and does not take medication for BP. She has no rash or itching or difficulty breathing today.  I was unable to schedule pt into office because of availability. Pt agrees to go to urgent care for treatment.  Care advice read to patient Pt verbalized understanding of all instructions.  Reason for Disposition . Systolic BP  >= 824 OR Diastolic >= 235  Answer Assessment - Initial Assessment Questions 1. BLOOD PRESSURE: "What is the blood pressure?" "Did you take at least two measurements 5 minutes apart?"     160/100 2. ONSET: "When did you take your blood pressure?"     2 hour ago 3. HOW: "How did you obtain the blood pressure?" (e.g., visiting nurse, automatic home BP monitor)     Nurse at church 4. HISTORY: "Do you have a history of high blood pressure?"     no 5. MEDICATIONS: "Are you taking any medications for blood pressure?" "Have you missed any doses recently?"     no 6. OTHER SYMPTOMS: "Do you have any symptoms?" (e.g., headache, chest pain, blurred vision, difficulty breathing, weakness)     Headache 7. PREGNANCY: "Is there any chance you are pregnant?" "When was your last menstrual period?"     no  Protocols used: HIGH BLOOD PRESSURE-A-AH

## 2018-06-08 ENCOUNTER — Telehealth: Payer: Self-pay | Admitting: Neurology

## 2018-06-08 NOTE — Telephone Encounter (Signed)
Patient called and was needing to get her results. Please Call. Thanks

## 2018-06-11 NOTE — Telephone Encounter (Signed)
Patient called this morning wanting to get her MRI results. Thanks

## 2018-06-11 NOTE — Telephone Encounter (Signed)
Called and advised Pt of results. 

## 2018-08-30 DIAGNOSIS — Z1231 Encounter for screening mammogram for malignant neoplasm of breast: Secondary | ICD-10-CM | POA: Diagnosis not present

## 2018-08-30 DIAGNOSIS — Z01419 Encounter for gynecological examination (general) (routine) without abnormal findings: Secondary | ICD-10-CM | POA: Diagnosis not present

## 2018-08-30 DIAGNOSIS — Z6838 Body mass index (BMI) 38.0-38.9, adult: Secondary | ICD-10-CM | POA: Diagnosis not present

## 2018-08-30 LAB — HM MAMMOGRAPHY: HM Mammogram: NORMAL (ref 0–4)

## 2018-10-01 ENCOUNTER — Telehealth: Payer: Self-pay | Admitting: Neurology

## 2018-10-01 NOTE — Telephone Encounter (Signed)
Patient is calling in about the [place]- she is needing a note so she can work from [employer] stating that she is an MS patient of Dr.Jaffe and that she is needing to stay away from people for her health sake. Please let patient know when it's ready. She was wondering about if you could email it? Thanks!

## 2018-10-01 NOTE — Telephone Encounter (Signed)
Patient called back. Please Call. Thanks

## 2018-10-01 NOTE — Telephone Encounter (Signed)
Called and spoke with Pt, advised her we will prepare a letter. It will be available on My Chart

## 2018-10-12 DIAGNOSIS — Z6839 Body mass index (BMI) 39.0-39.9, adult: Secondary | ICD-10-CM | POA: Diagnosis not present

## 2018-10-12 DIAGNOSIS — I10 Essential (primary) hypertension: Secondary | ICD-10-CM | POA: Diagnosis not present

## 2019-01-30 DIAGNOSIS — L821 Other seborrheic keratosis: Secondary | ICD-10-CM | POA: Diagnosis not present

## 2019-01-30 DIAGNOSIS — L814 Other melanin hyperpigmentation: Secondary | ICD-10-CM | POA: Diagnosis not present

## 2019-01-30 DIAGNOSIS — B078 Other viral warts: Secondary | ICD-10-CM | POA: Diagnosis not present

## 2019-01-30 DIAGNOSIS — D225 Melanocytic nevi of trunk: Secondary | ICD-10-CM | POA: Diagnosis not present

## 2019-04-15 DIAGNOSIS — I10 Essential (primary) hypertension: Secondary | ICD-10-CM | POA: Diagnosis not present

## 2019-04-30 DIAGNOSIS — Z23 Encounter for immunization: Secondary | ICD-10-CM | POA: Diagnosis not present

## 2019-06-16 LAB — HM DEXA SCAN: HM Dexa Scan: NORMAL

## 2019-06-16 LAB — HM PAP SMEAR

## 2019-06-16 LAB — HM MAMMOGRAPHY: HM Mammogram: NORMAL (ref 0–4)

## 2019-07-30 ENCOUNTER — Encounter (HOSPITAL_COMMUNITY): Payer: Self-pay

## 2019-07-30 ENCOUNTER — Other Ambulatory Visit: Payer: Self-pay

## 2019-07-30 ENCOUNTER — Telehealth: Payer: Self-pay | Admitting: General Practice

## 2019-07-30 ENCOUNTER — Emergency Department (HOSPITAL_COMMUNITY)
Admission: EM | Admit: 2019-07-30 | Discharge: 2019-07-30 | Disposition: A | Discharge status: Home / Self Care | Payer: BC Managed Care – PPO | Attending: Emergency Medicine | Admitting: Emergency Medicine

## 2019-07-30 ENCOUNTER — Emergency Department (HOSPITAL_COMMUNITY): Payer: BC Managed Care – PPO

## 2019-07-30 DIAGNOSIS — U071 COVID-19: Secondary | ICD-10-CM | POA: Diagnosis not present

## 2019-07-30 DIAGNOSIS — J129 Viral pneumonia, unspecified: Secondary | ICD-10-CM | POA: Insufficient documentation

## 2019-07-30 DIAGNOSIS — R0602 Shortness of breath: Secondary | ICD-10-CM | POA: Diagnosis not present

## 2019-07-30 DIAGNOSIS — Z87891 Personal history of nicotine dependence: Secondary | ICD-10-CM | POA: Insufficient documentation

## 2019-07-30 DIAGNOSIS — J1282 Pneumonia due to coronavirus disease 2019: Secondary | ICD-10-CM

## 2019-07-30 DIAGNOSIS — R05 Cough: Secondary | ICD-10-CM | POA: Diagnosis not present

## 2019-07-30 DIAGNOSIS — R000 Tachycardia, unspecified: Secondary | ICD-10-CM | POA: Diagnosis not present

## 2019-07-30 DIAGNOSIS — G35 Multiple sclerosis: Secondary | ICD-10-CM | POA: Insufficient documentation

## 2019-07-30 LAB — I-STAT BETA HCG BLOOD, ED (MC, WL, AP ONLY): I-stat hCG, quantitative: <5 m[IU]/mL (ref <5)

## 2019-07-30 LAB — CBC WITH DIFFERENTIAL/PLATELET
Abs Immature Granulocytes: 0.01 10*3/uL (ref 0.00–0.07)
Basophils Absolute: 0 10*3/uL (ref 0.0–0.1)
Basophils Relative: 0 %
Eosinophils Absolute: 0 10*3/uL (ref 0.0–0.5)
Eosinophils Relative: 0 %
HCT: 43.4 % (ref 36.0–46.0)
Hemoglobin: 13.6 g/dL (ref 12.0–15.0)
Immature Granulocytes: 0 %
Lymphocytes Relative: 24 %
Lymphs Abs: 1.2 10*3/uL (ref 0.7–4.0)
MCH: 30.2 pg (ref 26.0–34.0)
MCHC: 31.3 g/dL (ref 30.0–36.0)
MCV: 96.2 fL (ref 80.0–100.0)
Monocytes Absolute: 0.4 10*3/uL (ref 0.1–1.0)
Monocytes Relative: 8 %
Neutro Abs: 3.3 10*3/uL (ref 1.7–7.7)
Neutrophils Relative %: 68 %
Platelets: 280 10*3/uL (ref 150–400)
RBC: 4.51 MIL/uL (ref 3.87–5.11)
RDW: 12.3 % (ref 11.5–15.5)
WBC: 5 10*3/uL (ref 4.0–10.5)
nRBC: 0 % (ref 0.0–0.2)

## 2019-07-30 LAB — PROCALCITONIN: Procalcitonin: <0.1 ng/mL

## 2019-07-30 LAB — C-REACTIVE PROTEIN: CRP: 3.9 mg/dL — ABNORMAL HIGH (ref <1.0)

## 2019-07-30 LAB — LACTATE DEHYDROGENASE: LDH: 158 U/L (ref 98–192)

## 2019-07-30 LAB — COMPREHENSIVE METABOLIC PANEL
ALT: 30 U/L (ref 0–44)
AST: 38 U/L (ref 15–41)
Albumin: 4.1 g/dL (ref 3.5–5.0)
Alkaline Phosphatase: 75 U/L (ref 38–126)
Anion gap: 11 (ref 5–15)
BUN: 9 mg/dL (ref 6–20)
CO2: 26 mmol/L (ref 22–32)
Calcium: 9.4 mg/dL (ref 8.9–10.3)
Chloride: 103 mmol/L (ref 98–111)
Creatinine, Ser: 0.85 mg/dL (ref 0.44–1.00)
GFR calc Af Amer: >60 mL/min (ref >60)
GFR calc non Af Amer: >60 mL/min (ref >60)
Glucose, Bld: 104 mg/dL — ABNORMAL HIGH (ref 70–99)
Potassium: 3.7 mmol/L (ref 3.5–5.1)
Sodium: 140 mmol/L (ref 135–145)
Total Bilirubin: 0.5 mg/dL (ref 0.3–1.2)
Total Protein: 8.1 g/dL (ref 6.5–8.1)

## 2019-07-30 LAB — D-DIMER, QUANTITATIVE: D-Dimer, Quant: 0.34 ug/mL-FEU (ref 0.00–0.50)

## 2019-07-30 LAB — FERRITIN: Ferritin: 184 ng/mL (ref 11–307)

## 2019-07-30 LAB — LACTIC ACID, PLASMA: Lactic Acid, Venous: 1.1 mmol/L (ref 0.5–1.9)

## 2019-07-30 LAB — FIBRINOGEN: Fibrinogen: 638 mg/dL — ABNORMAL HIGH (ref 210–475)

## 2019-07-30 LAB — TRIGLYCERIDES: Triglycerides: 50 mg/dL (ref <150)

## 2019-07-30 LAB — POC SARS CORONAVIRUS 2 AG -  ED: SARS Coronavirus 2 Ag: POSITIVE — AB

## 2019-07-30 MED ORDER — ACETAMINOPHEN 500 MG PO TABS
1000.0000 mg | ORAL_TABLET | Freq: Once | ORAL | Status: AC
  Administered 2019-07-30: 1000 mg via ORAL
  Filled 2019-07-30: qty 2

## 2019-07-30 NOTE — Telephone Encounter (Signed)
Absolutely needs ER assessment especially if having a prior O2 sat at 75 with ambulation. IF husband with similarly low O2 levels at rest (91%) then would expect this to drop when he is moving around. Both need ER assessment.

## 2019-07-30 NOTE — ED Provider Notes (Signed)
[facility] DEPT Provider Note   CSN: [id number] Arrival date & time: 07/30/19  1705     History Chief Complaint  Patient presents with  . Shortness of Breath    Gabriela Stephens is a 59 y.o. female.  Patient with history of multiple sclerosis, not currently on treatment, no pulmonary history, remote smoking history presents the emergency department with complaint of shortness of breath in setting of flulike illness.  Patient is on day 7 of illness.  She has had body aches, fever to 100 F, chills, poor appetite.  She can still taste and smell normally.  She has had a cough.  No chest pain but has had some tightness in her chest at times.  No abdominal pain, vomiting.  She has had 1 or 2 episodes of diarrhea recently, nonbloody.  Patient has been in contact with her primary care doctor.  She had a coronavirus test at the health department earlier today and does not know the results.  She reports that her oxygen saturation was in the 80s on home pulse ox and worsened into the 70s when she walked up some stairs.  Patient 94% on arrival in ED on room air.  She does not have a chronic oxygen requirement.  She denies any known sick contacts or coronavirus contacts.  Her husband is at home with a cough as well.  No history of blood clots, no current lower extremity tenderness or swelling.        Past Medical History:  Diagnosis Date  . Multiple sclerosis [facility])     Patient Active Problem List   Diagnosis Date Noted  . Multiple sclerosis ([place]) 05/11/2017  . Conjunctivitis 06/19/2014  . Obesity (BMI 30-39.9) 06/05/2014  . Leiomyoma 01/27/2014  . Routine general medical examination at a health care facility 02/02/2012  . THYROMEGALY 07/26/2010  . SHOULDER, PAIN 01/06/2009    Past Surgical History:  Procedure Laterality Date  . KNEE CARTILAGE SURGERY    . LAPAROSCOPIC ASSISTED VAGINAL HYSTERECTOMY Bilateral 01/27/2014   Procedure: LAPAROSCOPIC ASSISTED  VAGINAL HYSTERECTOMY, BILATERAL SALPINGECTOMY;  Surgeon: Margarette Asal, MD;  Location: [facility];  Service: Gynecology;  Laterality: Bilateral;  . TUBAL LIGATION  1986     OB History   No obstetric history on file.     History reviewed. No pertinent family history.  Social History   Tobacco Use  . Smoking status: Former Research scientist (life sciences)  . Smokeless tobacco: Never Used  Substance Use Topics  . Alcohol use: Yes    Comment: occasional wine  . Drug use: No    Home Medications Prior to Admission medications   Medication Sig Start Date End Date Taking? Authorizing Provider  DUEXIS 800-26.6 MG TABS Take 1 tablet by mouth every 8 (eight) hours as needed. 04/17/17   [provider]  Ibuprofen-Diphenhydramine Cit (ADVIL PM PO) Take by mouth.    [provider]    Allergies    Patient has no known allergies.  Review of Systems   Review of Systems  Constitutional: Positive for appetite change, chills, fatigue and fever.  HENT: Negative for rhinorrhea and sore throat.   Eyes: Negative for redness.  Respiratory: Positive for cough and shortness of breath.   Cardiovascular: Negative for chest pain.  Gastrointestinal: Negative for abdominal pain, diarrhea, nausea and vomiting.  Genitourinary: Negative for dysuria.  Musculoskeletal: Positive for myalgias.  Skin: Negative for rash.  Neurological: Negative for headaches.    Physical Exam Updated Vital Signs BP Marland Kitchen)  144/79   Pulse (!) 112   Temp 99.9 F (37.7 C) (Oral)   Resp 20   Wt 95.7 kg   LMP 12/31/2011   SpO2 94%   BMI 39.87 kg/m   Physical Exam Vitals and nursing note reviewed.  Constitutional:      Appearance: She is well-developed.  HENT:     Head: Normocephalic and atraumatic.  Eyes:     General:        Right eye: No discharge.        Left eye: No discharge.     Conjunctiva/sclera: Conjunctivae normal.  Cardiovascular:     Rate and Rhythm: Regular rhythm. Tachycardia present.     Heart sounds:  Normal heart sounds.     Comments: Heart rate in the low 100s. Pulmonary:     Effort: Pulmonary effort is normal.     Breath sounds: Normal breath sounds. No decreased breath sounds.     Comments: Lung sounds are clear on exam.  No tachypnea. Abdominal:     Palpations: Abdomen is soft.     Tenderness: There is no abdominal tenderness.  Musculoskeletal:     Cervical back: Normal range of motion and neck supple.     Right lower leg: No edema.     Left lower leg: No edema.  Skin:    General: Skin is warm and dry.  Neurological:     Mental Status: She is alert.     ED Results / Procedures / Treatments   Labs (all labs ordered are listed, but only abnormal results are displayed) Labs Reviewed  COMPREHENSIVE METABOLIC PANEL - Abnormal; Notable for the following components:      Result Value   Glucose, Bld 104 (*)    All other components within normal limits  FIBRINOGEN - Abnormal; Notable for the following components:   Fibrinogen 638 (*)    All other components within normal limits  C-REACTIVE PROTEIN - Abnormal; Notable for the following components:   CRP 3.9 (*)    All other components within normal limits  POC SARS CORONAVIRUS 2 AG -  ED - Abnormal; Notable for the following components:   SARS Coronavirus 2 Ag POSITIVE (*)    All other components within normal limits  CULTURE, BLOOD (ROUTINE X 2)  CULTURE, BLOOD (ROUTINE X 2)  LACTIC ACID, PLASMA  CBC WITH DIFFERENTIAL/PLATELET  D-DIMER, QUANTITATIVE (NOT AT [facility])  PROCALCITONIN  LACTATE DEHYDROGENASE  FERRITIN  TRIGLYCERIDES  LACTIC ACID, PLASMA  I-STAT BETA HCG BLOOD, ED ([facility], [facility], [facility] ONLY)    ED ECG REPORT   Date: 07/30/2019  Rate:113  Rhythm: sinus tachycardia  QRS Axis: normal  Intervals: normal  ST/T Wave abnormalities: normal  Conduction Disutrbances:none  Narrative Interpretation:   Old EKG Reviewed: changes noted from 2013, faster today  I have personally reviewed the EKG tracing and agree with the  computerized printout as noted.  Radiology DG Chest 2 View  Result Date: 07/30/2019 CLINICAL DATA:  Shortness of breath, cough EXAM: CHEST - 2 VIEW COMPARISON:  07/31/2011 FINDINGS: Heart is normal size. Patchy bibasilar airspace opacities could reflect atelectasis or early infiltrates. No effusions. No acute bony abnormality. IMPRESSION: Patchy bibasilar atelectasis or early infiltrates. Electronically Signed   By: Rolm Baptise M.D.   On: 07/30/2019 17:43    Procedures Procedures (including critical care time)  Medications Ordered in ED Medications  acetaminophen (TYLENOL) tablet 1,000 mg (1,000 mg Oral Given 07/30/19 2114)    ED Course  I have reviewed  the triage vital signs and the nursing notes.  Pertinent labs & imaging results that were available during my care of the patient were reviewed by me and considered in my medical decision making (see chart for details).  Patient seen and examined.  X-ray and symptom profile concerning for COVID-19 pneumonia.  Patient will need ambulated to determine the extent of oxygen desaturation.  Currently on room air she is in the low to mid 90s at rest.  She is not in any respiratory distress, does not have any apparent increased work of breathing, or tachypnea.  Vital signs reviewed and are as follows: BP 123/69   Pulse 99   Temp (!) 101.8 F (38.8 C) (Oral)   Resp (!) 22   Wt 95.7 kg   LMP 12/31/2011   SpO2 94%   BMI 39.87 kg/m   Rapid Covid test is positive.  Patient's x-ray shows mild viral appearing pneumonia.  Ordered lab work-up, pending.  Patient was able to ambulate and maintain oxygen saturation on room air in the mid 90s.  Remainder of lab work without any concerning findings.  She has some abnormalities in inflammatory markers which are mild at this point.  She was given Tylenol for headache.  Heart rate improved.  I went and reassessed the patient.  She continues to be comfortable.  We discussed that she does not have any  current indications for admission to the hospital given that she does not have an oxygen requirement and any other worrisome lab abnormalities.    We also discussed that course of coronavirus can be very unpredictable and that she needs to be reevaluated if she develops increased work of breathing, worsening shortness of breath, persistent vomiting, or feels significantly worse.  She seems reliable to go home.  Her husband is in the same situation although his symptoms seem to be a little less severe.    Detailed discussion had with with patient regarding COVID-19 precautions and written instructions given as well.  We discussed need to isolate themselves for 14 days from onset of symptoms and have 72 hours of improvement prior to breaking isolation.  We discussed signs symptoms to return which include worsening shortness of breath, trouble breathing, or increased work of breathing.  Also return with persistent vomiting, confusion, passing out, or if they have any other concerns. Counseled on the need for rest and good hydration. Discussed that high-risk contacts should be aware of positive result and they need to quarantine and be tested if they develop any symptoms. Patient verbalizes understanding.   Leoda Sorce Sitar was evaluated in Emergency Department on 07/30/2019 for the symptoms described in the history of present illness. She was evaluated in the context of the global COVID-19 pandemic, which necessitated consideration that the patient might be at risk for infection with the SARS-CoV-2 virus that causes COVID-19. Institutional protocols and algorithms that pertain to the evaluation of patients at risk for COVID-19 are in a state of rapid change based on information released by regulatory bodies including the CDC and federal and state organizations. These policies and algorithms were followed during the patient's care in the ED.     MDM Rules/Calculators/A&P                      Patient with  COVID-19 infection, suspect mild pneumonia.  Reported hypoxia at home however this was not reproduced at rest or with exertion here in the emergency department.  From a respiratory standpoint patient is  not in any distress or extremis.  She looks like she does not feel well but is tolerating fluids.  She does not have any oxygen requirement, lab abnormalities which would necessitate admission today.  I feel that tonight given her condition now, she can be discharged home.  She was counseled as above.  Patient is in agreement.    Final Clinical Impression(s) / ED Diagnoses Final diagnoses:  COVID-19  Pneumonia due to COVID-19 virus    Rx / DC Orders ED Discharge Orders    None       Carlisle Cater, PA-C 07/30/19 [place], [place], DO 07/30/19 2232

## 2019-07-30 NOTE — Telephone Encounter (Signed)
ER assessment is correct triage giving symptomology and O2 sats.

## 2019-07-30 NOTE — Telephone Encounter (Signed)
Called and spoke with patient as she was on the schedule with C/O low O2 levels. She advised that since 07/24/19 She has been having Cough, Body Aches, Fever, and HA. Pt states that her and her husband went for COVID screening this morning. Pt states that when she went up stairs her O2 level dropped to 75. Pt checked O2 while on the phone with me and she was at 91 at rest. Stated that O2 level had not been above that since the 6th and typically has been around 89. Pt was advised that she and her husband, (also a [facility] pt) his O2 sats were 90 while on the phone with me, Should proceed to the nearest ER. I did keep appt with PCP for tomorrow in case she needed to be seen after ER. Pt stated an understanding.   Message routed to [place] as he is in office and to PCP.

## 2019-07-30 NOTE — ED Notes (Signed)
Pt 95-97% on RA while ambulating. ED PA made aware.

## 2019-07-30 NOTE — ED Triage Notes (Signed)
Pt c/o SHOB x for several days. Pt states that her O2 was at 75% at home today, and 88% prior to coming in. O2 was 91% upon sitting in triage chair in cubby, coming up to 94% after sitting about 4 minutes. Pt endorses diarrhea.  Pt was tested earlier today for COVID, has not received results yet.

## 2019-07-30 NOTE — ED Notes (Signed)
Josh, PA made aware of pt oral temp of 101.8 and pt requesting pain meds for headache.

## 2019-07-30 NOTE — Discharge Instructions (Signed)
Please read and follow all provided instructions.  Your diagnoses today include:  1. COVID-19   2. Pneumonia due to COVID-19 virus     Tests performed today include:  Chest x-ray - shows mild pneumonia  Blood counts and electrolytes and labs for coronavirus  Covid test -was positive  Vital signs. See below for your results today.   Medications prescribed:   None  Take any prescribed medications only as directed.  Home care instructions:  Follow any educational materials contained in this packet.  Please monitor your symptoms carefully.  The course of coronavirus pneumonia and its effect on breathing can be very unpredictable.  If you develop worsening shortness of breath, increased work of breathing, feel worse, develop persistent vomiting --you will need reevaluation should return to the emergency department.  Follow-up instructions: Please follow-up with your primary care provider in the next 3 days for further evaluation of your symptoms.   Return instructions:   Please return to the Emergency Department if you experience worsening symptoms.   Please return if you have any other emergent concerns.  Additional Information:  Your vital signs today were: BP 123/69    Pulse 99    Temp (!) 101.8 F (38.8 C) (Oral)    Resp (!) 22    Wt 95.7 kg    LMP 12/31/2011    SpO2 94%    BMI 39.87 kg/m  If your blood pressure (BP) was elevated above 135/85 this visit, please have this repeated by your doctor within one month. --------------

## 2019-07-31 ENCOUNTER — Other Ambulatory Visit: Payer: Self-pay

## 2019-07-31 ENCOUNTER — Encounter: Payer: Self-pay | Admitting: Family Medicine

## 2019-07-31 ENCOUNTER — Ambulatory Visit (INDEPENDENT_AMBULATORY_CARE_PROVIDER_SITE_OTHER): Payer: BC Managed Care – PPO | Admitting: Family Medicine

## 2019-07-31 VITALS — HR 95 | Temp 97.7°F

## 2019-07-31 DIAGNOSIS — J1282 Pneumonia due to coronavirus disease 2019: Secondary | ICD-10-CM

## 2019-07-31 DIAGNOSIS — U071 COVID-19: Secondary | ICD-10-CM

## 2019-07-31 MED ORDER — PROMETHAZINE-DM 6.25-15 MG/5ML PO SYRP: 5.0000 mL | ORAL_SOLUTION | Freq: Four times a day (QID) | ORAL | 0 refills | Status: DC | PRN

## 2019-07-31 MED ORDER — ALBUTEROL SULFATE HFA 108 (90 BASE) MCG/ACT IN AERS: 2.0000 | INHALATION_SPRAY | Freq: Four times a day (QID) | RESPIRATORY_TRACT | 1 refills | Status: DC | PRN

## 2019-07-31 NOTE — Progress Notes (Signed)
   Virtual Visit via Video   I connected with patient on 07/31/19 at  1:00 PM EST by a video enabled telemedicine application and verified that I am speaking with the correct person using two identifiers.  Location patient: Home Location provider: [facility], Office Persons participating in the virtual visit: Patient, Provider, [place] (Jess B)  I discussed the limitations of evaluation and management by telemedicine and the availability of in person appointments. The patient expressed understanding and agreed to proceed.  Subjective:   HPI:   ER f/u- pt went to ER yesterday due to COVID related SOB.  Day 8 of illness today.  + fever, body aches, chills, cough, chest tightness, diarrhea.  Yesterday had viral PNA on xray.  Husband is also sick.  Pt is trying to do breathing exercises.  Drinking water, gatorade, pedialyte.    ROS:   See pertinent positives and negatives per HPI.  Patient Active Problem List   Diagnosis Date Noted  . Multiple sclerosis ([place]) 05/11/2017  . Conjunctivitis 06/19/2014  . Obesity (BMI 30-39.9) 06/05/2014  . Leiomyoma 01/27/2014  . Routine general medical examination at a health care facility 02/02/2012  . THYROMEGALY 07/26/2010  . SHOULDER, PAIN 01/06/2009    Social History   Tobacco Use  . Smoking status: Former Research scientist (life sciences)  . Smokeless tobacco: Never Used  Substance Use Topics  . Alcohol use: Yes    Comment: occasional wine    Current Outpatient Medications:  .  amLODipine (NORVASC) 5 MG tablet, Take 5 mg by mouth daily., Disp: , Rfl:  .  ELDERBERRY PO, Take 1 lozenge by mouth every morning., Disp: , Rfl:  .  Multiple Vitamin (MULTIVITAMIN WITH MINERALS) TABS tablet, Take 1 tablet by mouth daily., Disp: , Rfl:   Allergies  Allergen Reactions  . Gadolinium Other (See Comments)    Objective:   Pulse 95   Temp 97.7 F (36.5 C) (Tympanic)   LMP 12/31/2011   SpO2 90%  AAOx3, NAD NCAT, EOMI No obvious CN deficits Coloring WNL Pt is  able to speak clearly, coherently without shortness of breath or increased work of breathing.  Thought process is linear.  Mood is appropriate.   Assessment and Plan:   COVID PNA- new.  Pt was evaluated in ER and confirmed to have COVID PNA.  Was not given meds or instructions.  Discussed Vit C, Vit D, and Zinc.  Encouraged increased fluids, alternating Tylenol/Ibuprofen for fever/bodyaches/HA.  Did discuss lying prone for SOB but only temporarily bc if shortness of breath is worsening, needs to return to ER for evaluation.  Will add cough syrup, mucinex, and albuterol.  Reviewed supportive care and red flags that should prompt return.  Pt expressed understanding and is in agreement w/ plan.    Annye Asa, MD 07/31/2019

## 2019-07-31 NOTE — Progress Notes (Signed)
I have discussed the procedure for the virtual visit with the patient who has given consent to proceed with assessment and treatment.   Pt unable to obtain vitals.   Seanpaul Preece L Makinzee Durley, CMA     

## 2019-08-01 ENCOUNTER — Inpatient Hospital Stay (HOSPITAL_COMMUNITY)
Admission: EM | Admit: 2019-08-01 | Discharge: 2019-08-04 | DRG: 177 | Disposition: A | Discharge status: Home / Self Care | Payer: BC Managed Care – PPO | Attending: Internal Medicine | Admitting: Internal Medicine

## 2019-08-01 ENCOUNTER — Telehealth: Payer: Self-pay | Admitting: Family Medicine

## 2019-08-01 ENCOUNTER — Other Ambulatory Visit: Payer: Self-pay

## 2019-08-01 ENCOUNTER — Encounter (HOSPITAL_COMMUNITY): Payer: Self-pay | Admitting: Internal Medicine

## 2019-08-01 ENCOUNTER — Encounter: Payer: Self-pay | Admitting: Family Medicine

## 2019-08-01 ENCOUNTER — Emergency Department (HOSPITAL_COMMUNITY): Payer: BC Managed Care – PPO

## 2019-08-01 DIAGNOSIS — Z79899 Other long term (current) drug therapy: Secondary | ICD-10-CM

## 2019-08-01 DIAGNOSIS — J9601 Acute respiratory failure with hypoxia: Secondary | ICD-10-CM | POA: Diagnosis not present

## 2019-08-01 DIAGNOSIS — E876 Hypokalemia: Secondary | ICD-10-CM | POA: Diagnosis present

## 2019-08-01 DIAGNOSIS — Z90710 Acquired absence of both cervix and uterus: Secondary | ICD-10-CM

## 2019-08-01 DIAGNOSIS — Z87891 Personal history of nicotine dependence: Secondary | ICD-10-CM | POA: Diagnosis not present

## 2019-08-01 DIAGNOSIS — E860 Dehydration: Secondary | ICD-10-CM | POA: Diagnosis present

## 2019-08-01 DIAGNOSIS — R0602 Shortness of breath: Secondary | ICD-10-CM | POA: Diagnosis not present

## 2019-08-01 DIAGNOSIS — G35 Multiple sclerosis: Secondary | ICD-10-CM | POA: Diagnosis present

## 2019-08-01 DIAGNOSIS — J1282 Pneumonia due to coronavirus disease 2019: Secondary | ICD-10-CM

## 2019-08-01 DIAGNOSIS — U071 COVID-19: Secondary | ICD-10-CM | POA: Diagnosis not present

## 2019-08-01 DIAGNOSIS — F329 Major depressive disorder, single episode, unspecified: Secondary | ICD-10-CM | POA: Diagnosis present

## 2019-08-01 DIAGNOSIS — I10 Essential (primary) hypertension: Secondary | ICD-10-CM | POA: Diagnosis not present

## 2019-08-01 DIAGNOSIS — F419 Anxiety disorder, unspecified: Secondary | ICD-10-CM | POA: Diagnosis present

## 2019-08-01 LAB — COMPREHENSIVE METABOLIC PANEL
ALT: 32 U/L (ref 0–44)
AST: 36 U/L (ref 15–41)
Albumin: 3.7 g/dL (ref 3.5–5.0)
Alkaline Phosphatase: 68 U/L (ref 38–126)
Anion gap: 10 (ref 5–15)
BUN: 13 mg/dL (ref 6–20)
CO2: 24 mmol/L (ref 22–32)
Calcium: 9 mg/dL (ref 8.9–10.3)
Chloride: 103 mmol/L (ref 98–111)
Creatinine, Ser: 0.8 mg/dL (ref 0.44–1.00)
GFR calc Af Amer: >60 mL/min (ref >60)
GFR calc non Af Amer: >60 mL/min (ref >60)
Glucose, Bld: 108 mg/dL — ABNORMAL HIGH (ref 70–99)
Potassium: 3.4 mmol/L — ABNORMAL LOW (ref 3.5–5.1)
Sodium: 137 mmol/L (ref 135–145)
Total Bilirubin: 0.5 mg/dL (ref 0.3–1.2)
Total Protein: 7.5 g/dL (ref 6.5–8.1)

## 2019-08-01 LAB — CBC
HCT: 40.4 % (ref 36.0–46.0)
Hemoglobin: 12.5 g/dL (ref 12.0–15.0)
MCH: 29.8 pg (ref 26.0–34.0)
MCHC: 30.9 g/dL (ref 30.0–36.0)
MCV: 96.2 fL (ref 80.0–100.0)
Platelets: 293 10*3/uL (ref 150–400)
RBC: 4.2 MIL/uL (ref 3.87–5.11)
RDW: 12.3 % (ref 11.5–15.5)
WBC: 5.5 10*3/uL (ref 4.0–10.5)
nRBC: 0 % (ref 0.0–0.2)

## 2019-08-01 LAB — CBC WITH DIFFERENTIAL/PLATELET
Abs Immature Granulocytes: 0.02 10*3/uL (ref 0.00–0.07)
Basophils Absolute: 0 10*3/uL (ref 0.0–0.1)
Basophils Relative: 1 %
Eosinophils Absolute: 0 10*3/uL (ref 0.0–0.5)
Eosinophils Relative: 0 %
HCT: 39.7 % (ref 36.0–46.0)
Hemoglobin: 12.8 g/dL (ref 12.0–15.0)
Immature Granulocytes: 0 %
Lymphocytes Relative: 26 %
Lymphs Abs: 1.5 10*3/uL (ref 0.7–4.0)
MCH: 31.1 pg (ref 26.0–34.0)
MCHC: 32.2 g/dL (ref 30.0–36.0)
MCV: 96.4 fL (ref 80.0–100.0)
Monocytes Absolute: 0.4 10*3/uL (ref 0.1–1.0)
Monocytes Relative: 7 %
Neutro Abs: 3.7 10*3/uL (ref 1.7–7.7)
Neutrophils Relative %: 66 %
Platelets: 291 10*3/uL (ref 150–400)
RBC: 4.12 MIL/uL (ref 3.87–5.11)
RDW: 12.3 % (ref 11.5–15.5)
WBC: 5.6 10*3/uL (ref 4.0–10.5)
nRBC: 0 % (ref 0.0–0.2)

## 2019-08-01 LAB — LACTIC ACID, PLASMA
Lactic Acid, Venous: 0.9 mmol/L (ref 0.5–1.9)
Lactic Acid, Venous: 0.9 mmol/L (ref 0.5–1.9)

## 2019-08-01 LAB — CREATININE, SERUM
Creatinine, Ser: 0.69 mg/dL (ref 0.44–1.00)
GFR calc Af Amer: >60 mL/min (ref >60)
GFR calc non Af Amer: >60 mL/min (ref >60)

## 2019-08-01 LAB — LACTATE DEHYDROGENASE: LDH: 175 U/L (ref 98–192)

## 2019-08-01 LAB — C-REACTIVE PROTEIN: CRP: 10.1 mg/dL — ABNORMAL HIGH (ref <1.0)

## 2019-08-01 LAB — FIBRINOGEN: Fibrinogen: 790 mg/dL — ABNORMAL HIGH (ref 210–475)

## 2019-08-01 LAB — D-DIMER, QUANTITATIVE: D-Dimer, Quant: 0.46 ug/mL-FEU (ref 0.00–0.50)

## 2019-08-01 LAB — TRIGLYCERIDES: Triglycerides: 61 mg/dL (ref <150)

## 2019-08-01 LAB — ABO/RH: ABO/RH(D): O POS

## 2019-08-01 LAB — PROCALCITONIN: Procalcitonin: <0.1 ng/mL

## 2019-08-01 LAB — FERRITIN: Ferritin: 238 ng/mL (ref 11–307)

## 2019-08-01 MED ORDER — ONDANSETRON HCL 4 MG/2ML IJ SOLN: 4.0000 mg | Freq: Four times a day (QID) | INTRAMUSCULAR | Status: DC | PRN

## 2019-08-01 MED ORDER — DIPHENHYDRAMINE HCL 50 MG PO CAPS
50.0000 mg | ORAL_CAPSULE | Freq: Every evening | ORAL | Status: DC | PRN
  Administered 2019-08-01 – 2019-08-03 (×3): 50 mg via ORAL
  Filled 2019-08-01 (×3): qty 1

## 2019-08-01 MED ORDER — ONDANSETRON HCL 4 MG PO TABS: 4.0000 mg | ORAL_TABLET | Freq: Four times a day (QID) | ORAL | Status: DC | PRN

## 2019-08-01 MED ORDER — SODIUM CHLORIDE 0.9 % IV SOLN
1.0000 g | INTRAVENOUS | Status: DC
  Administered 2019-08-01 – 2019-08-02 (×2): 1 g via INTRAVENOUS
  Filled 2019-08-01 (×2): qty 1

## 2019-08-01 MED ORDER — ADULT MULTIVITAMIN W/MINERALS CH
1.0000 | ORAL_TABLET | Freq: Every day | ORAL | Status: DC
  Administered 2019-08-02 – 2019-08-04 (×3): 1 via ORAL
  Filled 2019-08-01 (×3): qty 1

## 2019-08-01 MED ORDER — IPRATROPIUM-ALBUTEROL 20-100 MCG/ACT IN AERS
1.0000 | INHALATION_SPRAY | Freq: Four times a day (QID) | RESPIRATORY_TRACT | Status: DC
  Administered 2019-08-01 – 2019-08-02 (×4): 1 via RESPIRATORY_TRACT
  Filled 2019-08-01: qty 4

## 2019-08-01 MED ORDER — ENOXAPARIN SODIUM 60 MG/0.6ML ~~LOC~~ SOLN
50.0000 mg | SUBCUTANEOUS | Status: DC
  Administered 2019-08-01: 22:00:00 50 mg via SUBCUTANEOUS
  Filled 2019-08-01: qty 0.6

## 2019-08-01 MED ORDER — ZINC SULFATE 220 (50 ZN) MG PO CAPS
220.0000 mg | ORAL_CAPSULE | Freq: Every day | ORAL | Status: DC
  Administered 2019-08-02 – 2019-08-04 (×3): 220 mg via ORAL
  Filled 2019-08-01 (×3): qty 1

## 2019-08-01 MED ORDER — ALBUTEROL SULFATE HFA 108 (90 BASE) MCG/ACT IN AERS: 2.0000 | INHALATION_SPRAY | Freq: Four times a day (QID) | RESPIRATORY_TRACT | Status: DC | PRN

## 2019-08-01 MED ORDER — SODIUM CHLORIDE 0.9 % IV SOLN
100.0000 mg | Freq: Every day | INTRAVENOUS | Status: DC
  Administered 2019-08-02 – 2019-08-04 (×3): 100 mg via INTRAVENOUS
  Filled 2019-08-01 (×2): qty 20
  Filled 2019-08-01: qty 100

## 2019-08-01 MED ORDER — GUAIFENESIN-DM 100-10 MG/5ML PO SYRP
10.0000 mL | ORAL_SOLUTION | ORAL | Status: DC | PRN
  Administered 2019-08-03: 21:00:00 10 mL via ORAL
  Filled 2019-08-01: qty 10

## 2019-08-01 MED ORDER — SODIUM CHLORIDE 0.9 % IV SOLN: 100.0000 mg | Freq: Every day | INTRAVENOUS | Status: DC

## 2019-08-01 MED ORDER — SODIUM CHLORIDE 0.9 % IV SOLN: 200.0000 mg | Freq: Once | INTRAVENOUS | Status: DC

## 2019-08-01 MED ORDER — SODIUM CHLORIDE 0.9 % IV SOLN
200.0000 mg | Freq: Once | INTRAVENOUS | Status: AC
  Administered 2019-08-01: 200 mg via INTRAVENOUS
  Filled 2019-08-01: qty 200

## 2019-08-01 MED ORDER — VITAMIN C 100 MG PO TABS: 100.0000 mg | ORAL_TABLET | Freq: Every day | ORAL | Status: DC

## 2019-08-01 MED ORDER — ASCORBIC ACID 500 MG PO TABS
500.0000 mg | ORAL_TABLET | Freq: Every day | ORAL | Status: DC
  Administered 2019-08-02 – 2019-08-04 (×3): 500 mg via ORAL
  Filled 2019-08-01 (×3): qty 1

## 2019-08-01 MED ORDER — SODIUM CHLORIDE 0.9 % IV SOLN
500.0000 mg | INTRAVENOUS | Status: DC
  Administered 2019-08-01 – 2019-08-02 (×2): 500 mg via INTRAVENOUS
  Filled 2019-08-01 (×2): qty 500

## 2019-08-01 MED ORDER — AMLODIPINE BESYLATE 5 MG PO TABS
5.0000 mg | ORAL_TABLET | Freq: Every day | ORAL | Status: DC
  Administered 2019-08-02 – 2019-08-04 (×3): 5 mg via ORAL
  Filled 2019-08-01 (×3): qty 1

## 2019-08-01 MED ORDER — DEXAMETHASONE SODIUM PHOSPHATE 10 MG/ML IJ SOLN
6.0000 mg | Freq: Once | INTRAMUSCULAR | Status: AC
  Administered 2019-08-01: 6 mg via INTRAVENOUS
  Filled 2019-08-01: qty 1

## 2019-08-01 MED ORDER — ZINC GLUCONATE 50 MG PO TABS: 50.0000 mg | ORAL_TABLET | Freq: Every day | ORAL | Status: DC

## 2019-08-01 MED ORDER — DEXAMETHASONE SODIUM PHOSPHATE 10 MG/ML IJ SOLN
6.0000 mg | INTRAMUSCULAR | Status: DC
  Administered 2019-08-01 – 2019-08-03 (×3): 6 mg via INTRAVENOUS
  Filled 2019-08-01 (×3): qty 1

## 2019-08-01 MED ORDER — ALBUTEROL SULFATE HFA 108 (90 BASE) MCG/ACT IN AERS
2.0000 | INHALATION_SPRAY | Freq: Once | RESPIRATORY_TRACT | Status: AC
  Administered 2019-08-01: 2 via RESPIRATORY_TRACT
  Filled 2019-08-01: qty 6.7

## 2019-08-01 MED ORDER — ELDERBERRY 575 MG/5ML PO SYRP: ORAL_SOLUTION | Freq: Every morning | ORAL | Status: DC

## 2019-08-01 MED ORDER — HYDROCOD POLST-CPM POLST ER 10-8 MG/5ML PO SUER: 5.0000 mL | Freq: Two times a day (BID) | ORAL | Status: DC | PRN

## 2019-08-01 MED ORDER — ACETAMINOPHEN 325 MG PO TABS
650.0000 mg | ORAL_TABLET | Freq: Four times a day (QID) | ORAL | Status: DC | PRN
  Administered 2019-08-01 – 2019-08-03 (×3): 650 mg via ORAL
  Filled 2019-08-01 (×3): qty 2

## 2019-08-01 MED ORDER — VITAMIN D3 25 MCG (1000 UNIT) PO TABS
125.0000 ug | ORAL_TABLET | Freq: Every day | ORAL | Status: DC
  Administered 2019-08-02 – 2019-08-04 (×3): 5000 [IU] via ORAL
  Filled 2019-08-01 (×3): qty 5

## 2019-08-01 NOTE — ED Triage Notes (Signed)
Covid+ c/o home O2 reading being 77-85% on room air. Using inhaler that was prescribed yesterday.

## 2019-08-01 NOTE — Telephone Encounter (Signed)
Called and spoke w/ pt who reports both she and husband had episodes overnight where 'we just couldn't breathe.  We couldn't catch our breath'.  Since she did not sleep overnight, she just woke up a few minutes ago and upon awakening, her O2 was 77%.  During our phone call her O2 was 87% and she sounded winded speaking in complete sentences.  She reports husband's O2 is 85%.  Instructed her that she did need to go back to ER for evaluation given overnight episode and low O2 at rest.  Pt expressed understanding and is in agreement w/ plan.

## 2019-08-01 NOTE — Progress Notes (Signed)
Taking over care of patient. Agree with previous RN assessment. Denies any needs at this time will continue to monitor.  

## 2019-08-01 NOTE — ED Notes (Signed)
Urine sent to lab. Waiting for order.

## 2019-08-01 NOTE — Telephone Encounter (Signed)
Pt called to advise her O2 stats today are 83% and 77% Return to hospital?

## 2019-08-01 NOTE — Plan of Care (Signed)
  Problem: Activity: Goal: Risk for activity intolerance will decrease Outcome: Progressing   

## 2019-08-01 NOTE — ED Provider Notes (Signed)
[facility] DEPT Provider Note   CSN: [id number] Arrival date & time: 08/01/19  1432     History Chief Complaint  Patient presents with  . Coivd positive  . Shortness of Breath    Gabriela Stephens is a 59 y.o. female.  Gabriela Stephens is a 59 y.o. female with a history of multiple sclerosis, who was diagnosed with Covid on 1/12 after she presented to the emergency department with 7 days of shortness of breath, fevers chills, body aches and cough.  During this evaluation she had some mildly elevated inflammatory markers but was able to maintain O2 saturations with ambulation and was discharged home with symptomatic treatment.  She returns today reporting persistent symptoms with worsening shortness of breath, and states she checked her oxygen with home pulse ox and at rest it was ranging between 80% and 90, and at one point she noticed that it dropped to 85 and 77%  She called her PCP who recommended she come in for evaluated.  She states she continues to have a cough that is occasionally productive.  She denies chest pain but reports shortness of breath is worse with exertion but also present at rest.  She reports feeling generally fatigued with body aches, continued fevers and chills.  She has had some diarrhea, one episode of emesis this morning, denies abdominal pain.  Patient reports that she was prescribed an albuterol inhaler yesterday with her PCP which she used a few times without much improvement.        Past Medical History:  Diagnosis Date  . Multiple sclerosis [facility])     Patient Active Problem List   Diagnosis Date Noted  . Multiple sclerosis ([place]) 05/11/2017  . Conjunctivitis 06/19/2014  . Obesity (BMI 30-39.9) 06/05/2014  . Leiomyoma 01/27/2014  . Routine general medical examination at a health care facility 02/02/2012  . THYROMEGALY 07/26/2010  . SHOULDER, PAIN 01/06/2009    Past Surgical History:  Procedure Laterality Date   . KNEE CARTILAGE SURGERY    . LAPAROSCOPIC ASSISTED VAGINAL HYSTERECTOMY Bilateral 01/27/2014   Procedure: LAPAROSCOPIC ASSISTED VAGINAL HYSTERECTOMY, BILATERAL SALPINGECTOMY;  Surgeon: Margarette Asal, MD;  Location: [facility];  Service: Gynecology;  Laterality: Bilateral;  . TUBAL LIGATION  1986     OB History   No obstetric history on file.     No family history on file.  Social History   Tobacco Use  . Smoking status: Former Research scientist (life sciences)  . Smokeless tobacco: Never Used  Substance Use Topics  . Alcohol use: Yes    Comment: occasional wine  . Drug use: No    Home Medications Prior to Admission medications   Medication Sig Start Date End Date Taking? Authorizing Provider  albuterol (VENTOLIN HFA) 108 (90 Base) MCG/ACT inhaler Inhale 2 puffs into the lungs every 6 (six) hours as needed for wheezing or shortness of breath. 07/31/19   Midge Minium, MD  amLODipine (NORVASC) 5 MG tablet Take 5 mg by mouth daily. 07/11/19   [provider]  ELDERBERRY PO Take 1 lozenge by mouth every morning.    [provider]  Multiple Vitamin (MULTIVITAMIN WITH MINERALS) TABS tablet Take 1 tablet by mouth daily.    [provider]  promethazine-dextromethorphan (PROMETHAZINE-DM) 6.25-15 MG/5ML syrup Take 5 mLs by mouth 4 (four) times daily as needed. 07/31/19   Midge Minium, MD    Allergies    Gadolinium  Review of Systems   Review of Systems  Constitutional: Positive  for chills and fever.  HENT: Positive for congestion and rhinorrhea.   Respiratory: Positive for cough and shortness of breath.   Cardiovascular: Negative for chest pain and leg swelling.  Gastrointestinal: Positive for diarrhea, nausea and vomiting. Negative for abdominal pain.  Genitourinary: Negative for dysuria.  Musculoskeletal: Positive for myalgias.  Skin: Negative for color change and rash.  Neurological: Positive for headaches.    Physical Exam Updated Vital Signs BP 127/75 (BP  Location: Left Arm)   Pulse (!) 103   Temp 98.7 F (37.1 C) (Oral)   Resp 18   LMP 12/31/2011   SpO2 91% Comment: 91 at rest. 86-87 with movement.   Physical Exam Vitals and nursing note reviewed.  Constitutional:      General: She is not in acute distress.    Appearance: She is well-developed and normal weight. She is not diaphoretic.  HENT:     Head: Normocephalic and atraumatic.     Mouth/Throat:     Mouth: Mucous membranes are moist.     Pharynx: Oropharynx is clear.  Eyes:     General:        Right eye: No discharge.        Left eye: No discharge.  Cardiovascular:     Rate and Rhythm: Regular rhythm. Tachycardia present.     Heart sounds: Normal heart sounds. No murmur. No friction rub. No gallop.      Comments: Mild tachycardia, regular rhythm Pulmonary:     Effort: Pulmonary effort is normal. No respiratory distress.     Breath sounds: Decreased breath sounds and rales present. No wheezing.     Comments: Patient breathing comfortably on room air satting at 91% at rest and able to speak in short sentences, with standing and ambulation patient desats to 86% with some increased work of breathing and tachypnea.  On auscultation she has some decreased air movement with some scattered crackles. Abdominal:     General: Bowel sounds are normal. There is no distension.     Palpations: Abdomen is soft. There is no mass.     Tenderness: There is no abdominal tenderness. There is no guarding.     Comments: Abdomen soft, nondistended, nontender to palpation in all quadrants without guarding or peritoneal signs  Musculoskeletal:        General: No deformity.     Cervical back: Neck supple.  Skin:    General: Skin is warm and dry.     Capillary Refill: Capillary refill takes less than 2 seconds.  Neurological:     Mental Status: She is alert.     Coordination: Coordination normal.     Comments: Speech is clear, able to follow commands Moves extremities without ataxia,  coordination intact     ED Results / Procedures / Treatments   Labs (all labs ordered are listed, but only abnormal results are displayed) Labs Reviewed  COMPREHENSIVE METABOLIC PANEL - Abnormal; Notable for the following components:      Result Value   Potassium 3.4 (*)    Glucose, Bld 108 (*)    All other components within normal limits  FIBRINOGEN - Abnormal; Notable for the following components:   Fibrinogen 790 (*)    All other components within normal limits  C-REACTIVE PROTEIN - Abnormal; Notable for the following components:   CRP 10.1 (*)    All other components within normal limits  CULTURE, BLOOD (ROUTINE X 2)  CULTURE, BLOOD (ROUTINE X 2)  LACTIC ACID, PLASMA  CBC WITH  DIFFERENTIAL/PLATELET  D-DIMER, QUANTITATIVE (NOT AT [facility])  PROCALCITONIN  LACTATE DEHYDROGENASE  FERRITIN  TRIGLYCERIDES  LACTIC ACID, PLASMA    EKG EKG Interpretation  Date/Time:  Thursday August 01 2019 15:33:03 EST Ventricular Rate:  97 PR Interval:    QRS Duration: 84 QT Interval:  316 QTC Calculation: 402 R Axis:   29 Text Interpretation: Sinus rhythm Confirmed by Lennice Sites [phone]) on 08/01/2019 3:43:03 PM   Radiology DG Chest Port 1 View  Result Date: 08/01/2019 CLINICAL DATA:  Covid+ c/o home O2 reading being 77-85% on room air. Using inhaler that was prescribed yesterday. short of breath, hx MS EXAM: PORTABLE CHEST 1 VIEW COMPARISON:  07/30/2019 FINDINGS: Subtle hazy airspace opacities noted bilaterally, without significant change, consistent with multifocal COVID-19 pneumonia. No evidence of a pleural effusion or pneumothorax. Cardiac silhouette normal in size.  No mediastinal or hilar masses. Skeletal structures are grossly intact. IMPRESSION: 1. Subtle bilateral hazy airspace lung opacities consistent with COVID-19 multifocal pneumonia. Findings similar to the previous exam. Electronically Signed   By: Lajean Manes M.D.   On: 08/01/2019 16:19    Procedures Procedures  (including critical care time)  Medications Ordered in ED Medications  dexamethasone (DECADRON) injection 6 mg (has no administration in time range)  albuterol (VENTOLIN HFA) 108 (90 Base) MCG/ACT inhaler 2 puff (has no administration in time range)    ED Course  I have reviewed the triage vital signs and the nursing notes.  Pertinent labs & imaging results that were available during my care of the patient were reviewed by me and considered in my medical decision making (see chart for details).  Clinical Course as of Jul 31 2025  Thu Jul 31, 2048  857 59 year old female with 9 days of Covid symptoms, presenting with worsening shortness of breath and noted hypoxia on home pulse ox.  On arrival she is in no acute distress satting at 91% at rest on room air, with standing and ambulation she desats to 86% with increased work of breathing.  Lungs with some decreased air movement and scattered crackles, she will require admission for Covid pneumonia with hypoxia.  Will get Covid labs and inflammatory markers and repeat chest x-ray.  IV steroids started   [KF]  1619 Chest x-ray shows persisting opacities bilaterally, consistent with Covid pneumonia  DG Chest Port 1 View [KF]  1800 Lab work largely reassuring, no leukocytosis or significant electrolyte derangements, some inflammatory markers are elevated   [KF]  1800 Patient will require admission given hypoxia in the setting of Covid pneumonia.   [KF]  1816 Case discussed with Dr. Jonelle Sidle with [facility] hospitalist who will admit patient for acute hypoxic respiratory failure due to Covid pneumonia   [KF]    Clinical Course User Index [KF] Janet Berlin   MDM Rules/Calculators/A&P                       59 year old female who is Covid positive presenting with worsening shortness of breath, noted to be hypoxic to 86 with exertion, Covid pneumonia on x-ray, some elevation in inflammatory markers, started on steroids and remdesivir and will  require admission.  Case discussed with Dr. Jonelle Sidle with [facility] hospitalist who will see and admit the patient.  Kamsiyochukwu Friddle Lomeli was evaluated in Emergency Department on 08/01/2019 for the symptoms described in the history of present illness. She was evaluated in the context of the global COVID-19 pandemic, which necessitated consideration that the patient might be at  risk for infection with the SARS-CoV-2 virus that causes COVID-19. Institutional protocols and algorithms that pertain to the evaluation of patients at risk for COVID-19 are in a state of rapid change based on information released by regulatory bodies including the CDC and federal and state organizations. These policies and algorithms were followed during the patient's care in the ED.  Final Clinical Impression(s) / ED Diagnoses Final diagnoses:  Pneumonia due to COVID-19 virus    Rx / DC Orders ED Discharge Orders    None       Janet Berlin 08/01/19 2029    Lennice Sites, DO 08/03/19 [id number]

## 2019-08-01 NOTE — H&P (Signed)
History and Physical   Gabriela Stephens [place] [id number] DOB: Jun 23, 1961 DOA: 08/01/2019  Referring MD/NP/PA: Dr. Vanita Panda  PCP: Midge Minium, MD   Outpatient Specialists: None  Patient coming from: Home  Chief Complaint: Shortness of  HPI: Gabriela Stephens is a 59 y.o. female with medical history significant of multiple sclerosis, who was diagnosed with Covid on January 12 following 7 days of fever chills myalgias cough and shortness of breath.  Patient was doing fine at the time and was being treated conservatively at home.  Symptoms however continued to get worse.  She came back to the ER today with significant hypoxemia oxygen sats of 77% on room air.  Patient was seen and evaluated here.  She has shortness of breath with mild exertion.  She also has ongoing cough.  Mild fever.  Chest x-ray consistent with bilateral infiltrates.  Patient is being admitted with COVID-19 pneumonia with hypoxemia..  ED Course: Temperature is 99.1 blood pressure 117/67 pulse 103 respiratory rate of 27 oxygen sat 91% on 2 L currently 93% on 2 L.  CBC and chemistry appear to be all within normal except for potassium 3.4.  Inflammatory markers showed LDH 175 triglycerides 61 lactic acid 0.9 procalcitonin less than 0.1.  D-dimer 0.46 fibrinogen 790.  CRP is currently pending.  Chest x-ray showed bilateral airspace lung opacities consistent with COVID-19 multifocal pneumonia.  Patient is being admitted to the hospital as she is requiring about 2 L of oxygen now.  Review of Systems: As per HPI otherwise 10 point review of systems negative.    Past Medical History:  Diagnosis Date  . Multiple sclerosis ([place])     Past Surgical History:  Procedure Laterality Date  . KNEE CARTILAGE SURGERY    . LAPAROSCOPIC ASSISTED VAGINAL HYSTERECTOMY Bilateral 01/27/2014   Procedure: LAPAROSCOPIC ASSISTED VAGINAL HYSTERECTOMY, BILATERAL SALPINGECTOMY;  Surgeon: Margarette Asal, MD;  Location: [facility];  Service:  Gynecology;  Laterality: Bilateral;  . TUBAL LIGATION  1986     reports that she has quit smoking. She has never used smokeless tobacco. She reports current alcohol use. She reports that she does not use drugs.  Allergies  Allergen Reactions  . Gadolinium Other (See Comments)    No family history on file.   Prior to Admission medications   Medication Sig Start Date End Date Taking? Authorizing Provider  albuterol (VENTOLIN HFA) 108 (90 Base) MCG/ACT inhaler Inhale 2 puffs into the lungs every 6 (six) hours as needed for wheezing or shortness of breath. 07/31/19  Yes Midge Minium, MD  amLODipine (NORVASC) 5 MG tablet Take 5 mg by mouth daily. 07/11/19  Yes [provider]  Ascorbic Acid (VITAMIN C) 100 MG tablet Take 100 mg by mouth daily.   Yes [provider]  Cholecalciferol (VITAMIN D) 125 MCG (5000 UT) CAPS Take 125 mcg by mouth daily.   Yes [provider]  ELDERBERRY PO Take 1 lozenge by mouth every morning.   Yes [provider]  Multiple Vitamin (MULTIVITAMIN WITH MINERALS) TABS tablet Take 1 tablet by mouth daily.   Yes [provider]  promethazine-dextromethorphan (PROMETHAZINE-DM) 6.25-15 MG/5ML syrup Take 5 mLs by mouth 4 (four) times daily as needed. 07/31/19  Yes Midge Minium, MD  zinc gluconate 50 MG tablet Take 50 mg by mouth daily.   Yes [provider]    Physical Exam: Vitals:   08/01/19 1536 08/01/19 1653 08/01/19 1821 08/01/19 1930  BP:  131/71 117/67 124/63  Pulse:  90 100 97  Resp: (!) 23 (!) 27 (!) 22 (!) 22  Temp:      TempSrc:      SpO2:  94% 92% 93%  Weight: 93 kg     Height: 5\' 1"  (1.549 m)         Constitutional: NAD, acutely ill looking and anxious Vitals:   08/01/19 1536 08/01/19 1653 08/01/19 1821 08/01/19 1930  BP:  131/71 117/67 124/63  Pulse:  90 100 97  Resp: (!) 23 (!) 27 (!) 22 (!) 22  Temp:      TempSrc:      SpO2:  94% 92% 93%  Weight: 93 kg     Height: 5\' 1"   (1.549 m)      Eyes: PERRL, lids and conjunctivae normal ENMT: Mucous membranes are moist. Posterior pharynx clear of any exudate or lesions.Normal dentition.  Neck: normal, supple, no masses, no thyromegaly Respiratory: Good air entry with bilateral rhonchi, no wheeze, increased respiratory effort. No accessory muscle use.  Cardiovascular: Regular rate and rhythm, no murmurs / rubs / gallops. No extremity edema. 2+ pedal pulses. No carotid bruits.  Abdomen: no tenderness, no masses palpated. No hepatosplenomegaly. Bowel sounds positive.  Musculoskeletal: no clubbing / cyanosis. No joint deformity upper and lower extremities. Good ROM, no contractures. Normal muscle tone.  Skin: no rashes, lesions, ulcers. No induration Neurologic: CN 2-12 grossly intact. Sensation intact, DTR normal. Strength 5/5 in all 4.  Psychiatric: Normal judgment and insight. Alert and oriented x 3. Normal mood.     Labs on Admission: I have personally reviewed following labs and imaging studies  CBC: Recent Labs  Lab 07/30/19 1935 08/01/19 1700  WBC 5.0 5.6  NEUTROABS 3.3 3.7  HGB 13.6 12.8  HCT 43.4 39.7  MCV 96.2 96.4  PLT 280 [id number]   Basic Metabolic Panel: Recent Labs  Lab 07/30/19 1935 08/01/19 1700  NA 140 137  K 3.7 3.4*  CL 103 103  CO2 26 24  GLUCOSE 104* 108*  BUN 9 13  CREATININE 0.85 0.80  CALCIUM 9.4 9.0   GFR: Estimated Creatinine Clearance: 79.7 mL/min (by C-G formula based on SCr of 0.8 mg/dL). Liver Function Tests: Recent Labs  Lab 07/30/19 1935 08/01/19 1700  AST 38 36  ALT 30 32  ALKPHOS 75 68  BILITOT 0.5 0.5  PROT 8.1 7.5  ALBUMIN 4.1 3.7   No results for input(s): LIPASE, AMYLASE in the last 168 hours. No results for input(s): AMMONIA in the last 168 hours. Coagulation Profile: No results for input(s): INR, PROTIME in the last 168 hours. Cardiac Enzymes: No results for input(s): CKTOTAL, CKMB, CKMBINDEX, TROPONINI in the last 168 hours. BNP (last 3 results) No  results for input(s): PROBNP in the last 8760 hours. HbA1C: No results for input(s): HGBA1C in the last 72 hours. CBG: No results for input(s): GLUCAP in the last 168 hours. Lipid Profile: Recent Labs    07/30/19 1935 08/01/19 1700  TRIG 50 61   Thyroid Function Tests: No results for input(s): TSH, T4TOTAL, FREET4, T3FREE, THYROIDAB in the last 72 hours. Anemia Panel: Recent Labs    07/30/19 1935 08/01/19 1523  FERRITIN 184 238   Urine analysis: No results found for: COLORURINE, APPEARANCEUR, LABSPEC, PHURINE, GLUCOSEU, HGBUR, BILIRUBINUR, KETONESUR, PROTEINUR, UROBILINOGEN, NITRITE, LEUKOCYTESUR Sepsis Labs: @LABRCNTIP (procalcitonin:4,lacticidven:4) ) Recent Results (from the past 240 hour(s))  Blood Culture (routine x 2)     Status: None (Preliminary result)   Collection Time: 07/30/19  7:35 PM  Specimen: BLOOD  Result Value Ref Range Status   Specimen Description   Final    BLOOD RIGHT ANTECUBITAL Performed at [facility] [address], [place], [place] [address]    Special Requests   Final    BOTTLES DRAWN AEROBIC AND ANAEROBIC Blood Culture adequate volume Performed at [facility] [address], [place], [place] [address]    Culture   Final    NO GROWTH 2 DAYS Performed at [facility] [address] [place], [place] [address]    Report Status PENDING  Incomplete     Radiological Exams on Admission: DG Chest Port 1 View  Result Date: 08/01/2019 CLINICAL DATA:  Covid+ c/o home O2 reading being 77-85% on room air. Using inhaler that was prescribed yesterday. short of breath, hx MS EXAM: PORTABLE CHEST 1 VIEW COMPARISON:  07/30/2019 FINDINGS: Subtle hazy airspace opacities noted bilaterally, without significant change, consistent with multifocal COVID-19 pneumonia. No evidence of a pleural effusion or pneumothorax. Cardiac silhouette normal in size.  No mediastinal or hilar masses. Skeletal structures are grossly  intact. IMPRESSION: 1. Subtle bilateral hazy airspace lung opacities consistent with COVID-19 multifocal pneumonia. Findings similar to the previous exam. Electronically Signed   By: Lajean Manes M.D.   On: 08/01/2019 16:19     Assessment/Plan Principal Problem:   Pneumonia due to COVID-19 virus Active Problems:   Multiple sclerosis ([place])     #1 pneumonia due to COVID-19: Patient is now hypoxic requiring oxygen.  Admit to the hospital.  IV dexamethasone, IV Rocephin and Zithromax, remdesivir, oxygen will be initiated.  No Actemra at this point but follow CRP and decide.  #2 multiple sclerosis: Stable at baseline.  No change in treatment  #3 hypokalemia: Probably due to dehydration.  Hydrate and monitor   DVT prophylaxis: Lovenox Code Status: Full code Family Communication: No family at bedside Disposition Plan: Home Consults called: None Admission status: Inpatient  Severity of Illness: The appropriate patient status for this patient is INPATIENT. Inpatient status is judged to be reasonable and necessary in order to provide the required intensity of service to ensure the patient's safety. The patient's presenting symptoms, physical exam findings, and initial radiographic and laboratory data in the context of their chronic comorbidities is felt to place them at high risk for further clinical deterioration. Furthermore, it is not anticipated that the patient will be medically stable for discharge from the hospital within 2 midnights of admission. The following factors support the patient status of inpatient.   " The patient's presenting symptoms include shortness of breath and cough. " The worrisome physical exam findings include bilateral rhonchi. " The initial radiographic and laboratory data are worrisome because of evidence of multifocal pneumonia. " The chronic co-morbidities include multiple sclerosis.   * I certify that at the point of admission it is my clinical judgment  that the patient will require inpatient hospital care spanning beyond 2 midnights from the point of admission due to high intensity of service, high risk for further deterioration and high frequency of surveillance required.Barbette Merino MD [facility] Pager [phone]  If 7PM-7AM, please contact night-coverage [email or web address] Password [facility]  08/01/2019, 8:06 PM

## 2019-08-02 LAB — COMPREHENSIVE METABOLIC PANEL
ALT: 30 U/L (ref 0–44)
AST: 32 U/L (ref 15–41)
Albumin: 3.5 g/dL (ref 3.5–5.0)
Alkaline Phosphatase: 66 U/L (ref 38–126)
Anion gap: 11 (ref 5–15)
BUN: 11 mg/dL (ref 6–20)
CO2: 24 mmol/L (ref 22–32)
Calcium: 9.4 mg/dL (ref 8.9–10.3)
Chloride: 106 mmol/L (ref 98–111)
Creatinine, Ser: 0.62 mg/dL (ref 0.44–1.00)
GFR calc Af Amer: >60 mL/min (ref >60)
GFR calc non Af Amer: >60 mL/min (ref >60)
Glucose, Bld: 150 mg/dL — ABNORMAL HIGH (ref 70–99)
Potassium: 3.9 mmol/L (ref 3.5–5.1)
Sodium: 141 mmol/L (ref 135–145)
Total Bilirubin: 0.3 mg/dL (ref 0.3–1.2)
Total Protein: 7.5 g/dL (ref 6.5–8.1)

## 2019-08-02 LAB — CBC WITH DIFFERENTIAL/PLATELET
Abs Immature Granulocytes: 0.03 10*3/uL (ref 0.00–0.07)
Basophils Absolute: 0 10*3/uL (ref 0.0–0.1)
Basophils Relative: 0 %
Eosinophils Absolute: 0 10*3/uL (ref 0.0–0.5)
Eosinophils Relative: 0 %
HCT: 40.2 % (ref 36.0–46.0)
Hemoglobin: 12.5 g/dL (ref 12.0–15.0)
Immature Granulocytes: 1 %
Lymphocytes Relative: 24 %
Lymphs Abs: 1 10*3/uL (ref 0.7–4.0)
MCH: 30 pg (ref 26.0–34.0)
MCHC: 31.1 g/dL (ref 30.0–36.0)
MCV: 96.6 fL (ref 80.0–100.0)
Monocytes Absolute: 0.1 10*3/uL (ref 0.1–1.0)
Monocytes Relative: 2 %
Neutro Abs: 3 10*3/uL (ref 1.7–7.7)
Neutrophils Relative %: 73 %
Platelets: 310 10*3/uL (ref 150–400)
RBC: 4.16 MIL/uL (ref 3.87–5.11)
RDW: 12.2 % (ref 11.5–15.5)
WBC: 4.1 10*3/uL (ref 4.0–10.5)
nRBC: 0 % (ref 0.0–0.2)

## 2019-08-02 LAB — FERRITIN: Ferritin: 223 ng/mL (ref 11–307)

## 2019-08-02 LAB — D-DIMER, QUANTITATIVE: D-Dimer, Quant: 0.35 ug/mL-FEU (ref 0.00–0.50)

## 2019-08-02 LAB — C-REACTIVE PROTEIN: CRP: 9.7 mg/dL — ABNORMAL HIGH (ref <1.0)

## 2019-08-02 LAB — HIV ANTIBODY (ROUTINE TESTING W REFLEX): HIV Screen 4th Generation wRfx: NONREACTIVE

## 2019-08-02 MED ORDER — METOPROLOL TARTRATE 25 MG PO TABS
25.0000 mg | ORAL_TABLET | Freq: Two times a day (BID) | ORAL | Status: DC
  Administered 2019-08-02 – 2019-08-04 (×5): 25 mg via ORAL
  Filled 2019-08-02 (×4): qty 1

## 2019-08-02 MED ORDER — ENOXAPARIN SODIUM 40 MG/0.4ML ~~LOC~~ SOLN
40.0000 mg | SUBCUTANEOUS | Status: DC
  Administered 2019-08-02 – 2019-08-03 (×2): 40 mg via SUBCUTANEOUS
  Filled 2019-08-02 (×2): qty 0.4

## 2019-08-02 MED ORDER — METOPROLOL TARTRATE 5 MG/5ML IV SOLN: 2.5000 mg | INTRAVENOUS | Status: DC | PRN

## 2019-08-02 MED ORDER — IPRATROPIUM-ALBUTEROL 20-100 MCG/ACT IN AERS
1.0000 | INHALATION_SPRAY | Freq: Three times a day (TID) | RESPIRATORY_TRACT | Status: DC
  Administered 2019-08-03 – 2019-08-04 (×4): 1 via RESPIRATORY_TRACT
  Filled 2019-08-02: qty 4

## 2019-08-02 MED ORDER — CLONAZEPAM 0.5 MG PO TABS
0.5000 mg | ORAL_TABLET | Freq: Two times a day (BID) | ORAL | Status: DC
  Administered 2019-08-02 – 2019-08-04 (×4): 0.5 mg via ORAL
  Filled 2019-08-02 (×4): qty 1

## 2019-08-02 NOTE — Progress Notes (Signed)
PROGRESS NOTE    Gabriela Stephens  [id number] DOB: [birth year] DOA: 08/01/2019 PCP: Midge Minium, MD   Brief Narrative:  Gabriela Stephens is a 59 y.o. female with medical history significant of multiple sclerosis, who was diagnosed with Covid on January 12 following 7 days of fever chills myalgias cough and shortness of breath.  Patient was doing fine at the time and was being treated conservatively at home.  Symptoms however continued to get worse.  She came back to the ER today with significant hypoxemia oxygen sats of 77% on room air.  Patient was seen and evaluated here.  She has shortness of breath with mild exertion.  She also has ongoing cough.  Mild fever.  Chest x-ray consistent with bilateral infiltrates.  Patient is being admitted with COVID-19 pneumonia with hypoxemia.. Please see HPI for full details  Assessment & Plan:   Principal Problem:   Pneumonia due to COVID-19 virus Active Problems:   Multiple sclerosis ([place])  Clinical problems list 1.  Acute hypoxic respiratory failure secondary to pneumonia due to COVID-19 virus infection 2.  Multiple sclerosis 3.  Anxiety/depression 4.  Hypokalemia 5.  Essential hypertension  1.  Acute hypoxic respiratory failure secondary to COVID-19 virus infection. Patient placed on 2 L O2 by nasal cannula and saturating above 90% Remdesivir and dexamethasone initiated Patient started on ceftriaxone and Zithromax.  Will reevaluate antibiotics tomorrow and de-escalate with stable/clinical improvement Continue with supportive therapy with vitamin C, D and zinc Continue with bronchodilators Monitor inflammatory markers  2.  Multiple sclerosis.  Stable No intervention at this time  3.  Anxiety/depression Start Klonopin 0.5 mg twice daily  4.  Hypokalemia.  Resolved  5.  Essential hypertension Continue with amlodipine and metoprolol    DVT prophylaxis: Lovenox subacute Code Status: Full code  Family  Communication: None  Disposition Plan: Patient is from home and will likely be discharged home when clinically stable. No barrier to discharge  Consultants:   None  Procedures: None   Antimicrobials:  08/02/2019 ceftriaxone 08/02/2019 azithromycin   Subjective: Patient was seen at bedside.  Patient was sitting up in bed and eating breakfast.  Not in acute distress.  On 2 L O2 by nasal cannula and saturating above 90%. Currently on remdesivir, dexamethasone and supportive therapy. On empiric antibiotics ceftriaxone and azithromycin.  Will reassess tomorrow and possibly de-escalate antibiotics with clinical improvement in blood culture report.  Objective: Vitals:   08/02/19 0339 08/02/19 1143 08/02/19 1158 08/02/19 1203  BP:  132/72    Pulse:  92 (!) 128 96  Resp:  18    Temp:  98.4 F (36.9 C)    TempSrc:  Oral    SpO2: 94% 95%    Weight:      Height:        Intake/Output Summary (Last 24 hours) at 08/02/2019 1514 Last data filed at 08/02/2019 1500 Gross per 24 hour  Intake 450 ml  Output --  Net 450 ml   Filed Weights   08/01/19 1536  Weight: 93 kg    Examination:  General exam: Appears calm and comfortable on 2 L O2 by nasal cannula Respiratory system: Clear to auscultation. Respiratory effort normal. Cardiovascular system: S1 & S2 heard, RRR. No JVD, murmurs, rubs, gallops or clicks. No pedal edema. Gastrointestinal system: Abdomen is nondistended, soft and nontender. No organomegaly or masses felt. Normal bowel sounds heard. Central nervous system: Alert and oriented. No focal neurological deficits. Extremities: Symmetric 5 x 5 power.  Skin: No rashes, lesions or ulcers Psychiatry: Judgement and insight appear normal. Mood & affect appropriate.     Data Reviewed: I have personally reviewed following labs and imaging studies  CBC: Recent Labs  Lab 07/30/19 1935 08/01/19 1700 08/01/19 2058 08/02/19 0317  WBC 5.0 5.6 5.5 4.1  NEUTROABS 3.3 3.7  --  3.0   HGB 13.6 12.8 12.5 12.5  HCT 43.4 39.7 40.4 40.2  MCV 96.2 96.4 96.2 96.6  PLT 280 291 293 [id number]   Basic Metabolic Panel: Recent Labs  Lab 07/30/19 1935 08/01/19 1700 08/01/19 2058 08/02/19 0317  NA 140 137  --  141  K 3.7 3.4*  --  3.9  CL 103 103  --  106  CO2 26 24  --  24  GLUCOSE 104* 108*  --  150*  BUN 9 13  --  11  CREATININE 0.85 0.80 0.69 0.62  CALCIUM 9.4 9.0  --  9.4   GFR: Estimated Creatinine Clearance: 79.7 mL/min (by C-G formula based on SCr of 0.62 mg/dL). Liver Function Tests: Recent Labs  Lab 07/30/19 1935 08/01/19 1700 08/02/19 0317  AST 38 36 32  ALT 30 32 30  ALKPHOS 75 68 66  BILITOT 0.5 0.5 0.3  PROT 8.1 7.5 7.5  ALBUMIN 4.1 3.7 3.5   No results for input(s): LIPASE, AMYLASE in the last 168 hours. No results for input(s): AMMONIA in the last 168 hours. Coagulation Profile: No results for input(s): INR, PROTIME in the last 168 hours. Cardiac Enzymes: No results for input(s): CKTOTAL, CKMB, CKMBINDEX, TROPONINI in the last 168 hours. BNP (last 3 results) No results for input(s): PROBNP in the last 8760 hours. HbA1C: No results for input(s): HGBA1C in the last 72 hours. CBG: No results for input(s): GLUCAP in the last 168 hours. Lipid Profile: Recent Labs    07/30/19 1935 08/01/19 1700  TRIG 50 61   Thyroid Function Tests: No results for input(s): TSH, T4TOTAL, FREET4, T3FREE, THYROIDAB in the last 72 hours. Anemia Panel: Recent Labs    08/01/19 1523 08/02/19 0317  FERRITIN 238 223   Sepsis Labs: Recent Labs  Lab 07/30/19 1935 08/01/19 1700 08/01/19 2058  PROCALCITON <0.10 <0.10  --   LATICACIDVEN 1.1 0.9 0.9    Recent Results (from the past 240 hour(s))  Blood Culture (routine x 2)     Status: None (Preliminary result)   Collection Time: 07/30/19  7:35 PM   Specimen: BLOOD  Result Value Ref Range Status   Specimen Description   Final    BLOOD RIGHT ANTECUBITAL Performed at [facility], [place]  [address] [place], [place] [address]    Special Requests   Final    BOTTLES DRAWN AEROBIC AND ANAEROBIC Blood Culture adequate volume Performed at [facility] [address] [place], [place] [address]    Culture   Final    NO GROWTH 2 DAYS Performed at [facility] [address], [place],  [address]    Report Status PENDING  Incomplete         Radiology Studies: DG Chest Port 1 View  Result Date: 08/01/2019 CLINICAL DATA:  Covid+ c/o home O2 reading being 77-85% on room air. Using inhaler that was prescribed yesterday. short of breath, hx MS EXAM: PORTABLE CHEST 1 VIEW COMPARISON:  07/30/2019 FINDINGS: Subtle hazy airspace opacities noted bilaterally, without significant change, consistent with multifocal COVID-19 pneumonia. No evidence of a pleural effusion or pneumothorax. Cardiac silhouette normal in  size.  No mediastinal or hilar masses. Skeletal structures are grossly intact. IMPRESSION: 1. Subtle bilateral hazy airspace lung opacities consistent with COVID-19 multifocal pneumonia. Findings similar to the previous exam. Electronically Signed   By: Lajean Manes M.D.   On: 08/01/2019 16:19        Scheduled Meds: . amLODipine  5 mg Oral Daily  . vitamin C  500 mg Oral Daily  . cholecalciferol  125 mcg Oral Daily  . clonazePAM  0.5 mg Oral BID  . dexamethasone (DECADRON) injection  6 mg Intravenous Q24H  . enoxaparin (LOVENOX) injection  40 mg Subcutaneous Q24H  . Ipratropium-Albuterol  1 puff Inhalation Q6H  . metoprolol tartrate  25 mg Oral BID  . multivitamin with minerals  1 tablet Oral Daily  . zinc sulfate  220 mg Oral Daily   Continuous Infusions: . azithromycin 500 mg (08/01/19 2330)  . cefTRIAXone (ROCEPHIN)  IV 1 g (08/01/19 2214)  . remdesivir 100 mg in NS 100 mL 100 mg (08/02/19 0846)     LOS: 1 day    Time spent: [place], MD [facility] Pager [phone] xxx [phone]  If 7PM-7AM, please  contact night-coverage [email or web address] Password [facility] 08/02/2019, 3:14 PM

## 2019-08-02 NOTE — Progress Notes (Signed)
Pt has yellow MEWS. Not an acute change for her. Pulse runs high with A-fib noted on the monitor. Currently on Amlodipine 5mg  PO daily and received this medication this morning. New order for Metoprolol 25 mg PO BID. No current signs and symptoms noted. Will cont to mx.

## 2019-08-03 LAB — CBC WITH DIFFERENTIAL/PLATELET
Abs Immature Granulocytes: 0.07 10*3/uL (ref 0.00–0.07)
Basophils Absolute: 0 10*3/uL (ref 0.0–0.1)
Basophils Relative: 0 %
Eosinophils Absolute: 0 10*3/uL (ref 0.0–0.5)
Eosinophils Relative: 0 %
HCT: 40.2 % (ref 36.0–46.0)
Hemoglobin: 12.3 g/dL (ref 12.0–15.0)
Immature Granulocytes: 1 %
Lymphocytes Relative: 17 %
Lymphs Abs: 1.5 10*3/uL (ref 0.7–4.0)
MCH: 29.8 pg (ref 26.0–34.0)
MCHC: 30.6 g/dL (ref 30.0–36.0)
MCV: 97.3 fL (ref 80.0–100.0)
Monocytes Absolute: 0.4 10*3/uL (ref 0.1–1.0)
Monocytes Relative: 4 %
Neutro Abs: 7 10*3/uL (ref 1.7–7.7)
Neutrophils Relative %: 78 %
Platelets: 400 10*3/uL (ref 150–400)
RBC: 4.13 MIL/uL (ref 3.87–5.11)
RDW: 12.4 % (ref 11.5–15.5)
WBC: 8.9 10*3/uL (ref 4.0–10.5)
nRBC: 0 % (ref 0.0–0.2)

## 2019-08-03 LAB — COMPREHENSIVE METABOLIC PANEL
ALT: 26 U/L (ref 0–44)
AST: 26 U/L (ref 15–41)
Albumin: 3.3 g/dL — ABNORMAL LOW (ref 3.5–5.0)
Alkaline Phosphatase: 62 U/L (ref 38–126)
Anion gap: 9 (ref 5–15)
BUN: 14 mg/dL (ref 6–20)
CO2: 24 mmol/L (ref 22–32)
Calcium: 8.9 mg/dL (ref 8.9–10.3)
Chloride: 105 mmol/L (ref 98–111)
Creatinine, Ser: 0.69 mg/dL (ref 0.44–1.00)
GFR calc Af Amer: >60 mL/min (ref >60)
GFR calc non Af Amer: >60 mL/min (ref >60)
Glucose, Bld: 154 mg/dL — ABNORMAL HIGH (ref 70–99)
Potassium: 3.9 mmol/L (ref 3.5–5.1)
Sodium: 138 mmol/L (ref 135–145)
Total Bilirubin: 0.3 mg/dL (ref 0.3–1.2)
Total Protein: 7 g/dL (ref 6.5–8.1)

## 2019-08-03 LAB — FERRITIN: Ferritin: 278 ng/mL (ref 11–307)

## 2019-08-03 LAB — D-DIMER, QUANTITATIVE: D-Dimer, Quant: 0.39 ug/mL-FEU (ref 0.00–0.50)

## 2019-08-03 LAB — C-REACTIVE PROTEIN: CRP: 3.4 mg/dL — ABNORMAL HIGH (ref <1.0)

## 2019-08-03 NOTE — Progress Notes (Signed)
PROGRESS NOTE    Gabriela Stephens  [id number] DOB: October 20, 1960 DOA: 08/01/2019 PCP: Midge Minium, MD   Brief Narrative:  Gabriela Stephens is a 59 y.o. female with medical history significant of multiple sclerosis, who was diagnosed with Covid on January 12 following 7 days of fever chills myalgias cough and shortness of breath.  Patient was doing fine at the time and was being treated conservatively at home.  Symptoms however continued to get worse.  She came back to the ER today with significant hypoxemia oxygen sats of 77% on room air.  Patient was seen and evaluated here.  She has shortness of breath with mild exertion.  She also has ongoing cough.  Mild fever.  Chest x-ray consistent with bilateral infiltrates.  Patient is being admitted with COVID-19 pneumonia with hypoxemia.. Please see HPI for full details  Assessment & Plan:   Principal Problem:   Pneumonia due to COVID-19 virus Active Problems:   Multiple sclerosis ([place])  Clinical problems list 1.  Acute hypoxic respiratory failure secondary to pneumonia due to COVID-19 virus infection 2.  Multiple sclerosis 3.  Anxiety/depression 4.  Hypokalemia 5.  Essential hypertension  1.  Acute hypoxic respiratory failure secondary to COVID-19 virus infection. Patient placed on 2 L O2 by nasal cannula and saturating above 90% Remdesivir and dexamethasone initiated Patient started on ceftriaxone and Zithromax.  Will reevaluate antibiotics tomorrow and de-escalate with stable/clinical improvement Continue with supportive therapy with vitamin C, D and zinc Continue with bronchodilators Monitor inflammatory markers  2.  Multiple sclerosis.  Stable No intervention at this time  3.  Anxiety/depression Start Klonopin 0.5 mg twice daily  4.  Hypokalemia.  Resolved  5.  Essential hypertension Continue with amlodipine and metoprolol    DVT prophylaxis: Lovenox subacute Code Status: Full code  Family  Communication: None  Disposition Plan: Patient is from home and will likely be discharged home when clinically stable. No barrier to discharge For possible discharge home tomorrow  Consultants:   None  Procedures: None   Antimicrobials:  Discontinued ceftriaxone and azithromycin on 08/03/2019   Subjective: Patient was seen at bedside.  Patient sitting out in chair and eating breakfast.  Reported desaturating to 94% on room air and at 98% on 2 L O2. We will discontinue O2 completely her monitor her room air. Possible discharge home tomorrow.  Completed a form remdesivir tomorrow.    Objective: Vitals:   08/02/19 1203 08/02/19 1951 08/03/19 0514 08/03/19 1218  BP:  114/69 (!) 113/50 127/70  Pulse: 96 89 74 83  Resp:  18 18 16   Temp:  98.6 F (37 C) 98 F (36.7 C) 98.3 F (36.8 C)  TempSrc:  Oral Oral Oral  SpO2:  91% 98% 91%  Weight:      Height:        Intake/Output Summary (Last 24 hours) at 08/03/2019 1424 Last data filed at 08/03/2019 0500 Gross per 24 hour  Intake 870.07 ml  Output --  Net 870.07 ml   Filed Weights   08/01/19 1536  Weight: 93 kg    Examination:  General exam: Appears calm and comfortable.  Saturating at 94% on room air.   Respiratory system: Clear to auscultation. Respiratory effort normal. Cardiovascular system: S1 & S2 heard, RRR. No JVD, murmurs, rubs, gallops or clicks. No pedal edema. Gastrointestinal system: Abdomen is nondistended, soft and nontender. No organomegaly or masses felt. Normal bowel sounds heard. Central nervous system: Alert and oriented. No focal neurological deficits.  Extremities: Symmetric 5 x 5 power. Skin: No rashes, lesions or ulcers Psychiatry: Judgement and insight appear normal. Mood & affect appropriate.     Data Reviewed: I have personally reviewed following labs and imaging studies  CBC: Recent Labs  Lab 07/30/19 1935 08/01/19 1700 08/01/19 2058 08/02/19 0317 08/03/19 0318  WBC 5.0 5.6 5.5 4.1  8.9  NEUTROABS 3.3 3.7  --  3.0 7.0  HGB 13.6 12.8 12.5 12.5 12.3  HCT 43.4 39.7 40.4 40.2 40.2  MCV 96.2 96.4 96.2 96.6 97.3  PLT 280 291 293 310 [id number]   Basic Metabolic Panel: Recent Labs  Lab 07/30/19 1935 08/01/19 1700 08/01/19 2058 08/02/19 0317 08/03/19 0318  NA 140 137  --  141 138  K 3.7 3.4*  --  3.9 3.9  CL 103 103  --  106 105  CO2 26 24  --  24 24  GLUCOSE 104* 108*  --  150* 154*  BUN 9 13  --  11 14  CREATININE 0.85 0.80 0.69 0.62 0.69  CALCIUM 9.4 9.0  --  9.4 8.9   GFR: Estimated Creatinine Clearance: 79.7 mL/min (by C-G formula based on SCr of 0.69 mg/dL). Liver Function Tests: Recent Labs  Lab 07/30/19 1935 08/01/19 1700 08/02/19 0317 08/03/19 0318  AST 38 36 32 26  ALT 30 32 30 26  ALKPHOS 75 68 66 62  BILITOT 0.5 0.5 0.3 0.3  PROT 8.1 7.5 7.5 7.0  ALBUMIN 4.1 3.7 3.5 3.3*   No results for input(s): LIPASE, AMYLASE in the last 168 hours. No results for input(s): AMMONIA in the last 168 hours. Coagulation Profile: No results for input(s): INR, PROTIME in the last 168 hours. Cardiac Enzymes: No results for input(s): CKTOTAL, CKMB, CKMBINDEX, TROPONINI in the last 168 hours. BNP (last 3 results) No results for input(s): PROBNP in the last 8760 hours. HbA1C: No results for input(s): HGBA1C in the last 72 hours. CBG: No results for input(s): GLUCAP in the last 168 hours. Lipid Profile: Recent Labs    08/01/19 1700  TRIG 61   Thyroid Function Tests: No results for input(s): TSH, T4TOTAL, FREET4, T3FREE, THYROIDAB in the last 72 hours. Anemia Panel: Recent Labs    08/02/19 0317 08/03/19 0318  FERRITIN 223 278   Sepsis Labs: Recent Labs  Lab 07/30/19 1935 08/01/19 1700 08/01/19 2058  PROCALCITON <0.10 <0.10  --   LATICACIDVEN 1.1 0.9 0.9    Recent Results (from the past 240 hour(s))  Blood Culture (routine x 2)     Status: None (Preliminary result)   Collection Time: 07/30/19  7:35 PM   Specimen: BLOOD  Result Value Ref Range  Status   Specimen Description   Final    BLOOD RIGHT ANTECUBITAL Performed at [facility], [place] [address], [place], [place] [address]    Special Requests   Final    BOTTLES DRAWN AEROBIC AND ANAEROBIC Blood Culture adequate volume Performed at [facility] [address], [place], [place] [address]    Culture   Final    NO GROWTH 4 DAYS Performed at [facility], [place] [address], [place],  [address]    Report Status PENDING  Incomplete  Blood Culture (routine x 2)     Status: None (Preliminary result)   Collection Time: 08/01/19  5:00 PM   Specimen: BLOOD  Result Value Ref Range Status   Specimen Description   Final    BLOOD LEFT ANTECUBITAL Performed at [facility], [place] [address] [place], [place] [address]    Special Requests   Final    BOTTLES DRAWN AEROBIC AND ANAEROBIC Blood Culture results may not be optimal due to an inadequate volume of blood received in culture bottles Performed at [facility] [address], [place], [place] [address]    Culture   Final    NO GROWTH 2 DAYS Performed at [facility] [address], [place], [place] [address]    Report Status PENDING  Incomplete  Blood Culture (routine x 2)     Status: None (Preliminary result)   Collection Time: 08/01/19  5:20 PM   Specimen: BLOOD  Result Value Ref Range Status   Specimen Description   Final    BLOOD RIGHT ANTECUBITAL Performed at [facility] [address], [place], [place] [address]    Special Requests   Final    BOTTLES DRAWN AEROBIC AND ANAEROBIC Blood Culture adequate volume Performed at [facility] [address], [place], [place] [address]    Culture   Final    NO GROWTH 2 DAYS Performed at [facility] [address], [place], [place] [address]    Report Status PENDING  Incomplete         Radiology Studies: DG Chest Port 1  View  Result Date: 08/01/2019 CLINICAL DATA:  Covid+ c/o home O2 reading being 77-85% on room air. Using inhaler that was prescribed yesterday. short of breath, hx MS EXAM: PORTABLE CHEST 1 VIEW COMPARISON:  07/30/2019 FINDINGS: Subtle hazy airspace opacities noted bilaterally, without significant change, consistent with multifocal COVID-19 pneumonia. No evidence of a pleural effusion or pneumothorax. Cardiac silhouette normal in size.  No mediastinal or hilar masses. Skeletal structures are grossly intact. IMPRESSION: 1. Subtle bilateral hazy airspace lung opacities consistent with COVID-19 multifocal pneumonia. Findings similar to the previous exam. Electronically Signed   By: Lajean Manes M.D.   On: 08/01/2019 16:19        Scheduled Meds: . amLODipine  5 mg Oral Daily  . vitamin C  500 mg Oral Daily  . cholecalciferol  125 mcg Oral Daily  . clonazePAM  0.5 mg Oral BID  . dexamethasone (DECADRON) injection  6 mg Intravenous Q24H  . enoxaparin (LOVENOX) injection  40 mg Subcutaneous Q24H  . Ipratropium-Albuterol  1 puff Inhalation TID  . metoprolol tartrate  25 mg Oral BID  . multivitamin with minerals  1 tablet Oral Daily  . zinc sulfate  220 mg Oral Daily   Continuous Infusions: . remdesivir 100 mg in NS 100 mL 100 mg (08/03/19 0818)     LOS: 2 days    Time spent: [place], MD [facility] Pager [phone] xxx [phone]  If 7PM-7AM, please contact night-coverage [email or web address] Password [facility] 08/03/2019, 2:24 PM

## 2019-08-04 LAB — COMPREHENSIVE METABOLIC PANEL
ALT: 27 U/L (ref 0–44)
AST: 25 U/L (ref 15–41)
Albumin: 3.3 g/dL — ABNORMAL LOW (ref 3.5–5.0)
Alkaline Phosphatase: 64 U/L (ref 38–126)
Anion gap: 8 (ref 5–15)
BUN: 15 mg/dL (ref 6–20)
CO2: 27 mmol/L (ref 22–32)
Calcium: 9 mg/dL (ref 8.9–10.3)
Chloride: 108 mmol/L (ref 98–111)
Creatinine, Ser: 0.7 mg/dL (ref 0.44–1.00)
GFR calc Af Amer: >60 mL/min (ref >60)
GFR calc non Af Amer: >60 mL/min (ref >60)
Glucose, Bld: 140 mg/dL — ABNORMAL HIGH (ref 70–99)
Potassium: 4 mmol/L (ref 3.5–5.1)
Sodium: 143 mmol/L (ref 135–145)
Total Bilirubin: 0.5 mg/dL (ref 0.3–1.2)
Total Protein: 7 g/dL (ref 6.5–8.1)

## 2019-08-04 LAB — CBC WITH DIFFERENTIAL/PLATELET
Abs Immature Granulocytes: 0.21 10*3/uL — ABNORMAL HIGH (ref 0.00–0.07)
Basophils Absolute: 0 10*3/uL (ref 0.0–0.1)
Basophils Relative: 0 %
Eosinophils Absolute: 0 10*3/uL (ref 0.0–0.5)
Eosinophils Relative: 0 %
HCT: 40.4 % (ref 36.0–46.0)
Hemoglobin: 12.6 g/dL (ref 12.0–15.0)
Immature Granulocytes: 2 %
Lymphocytes Relative: 19 %
Lymphs Abs: 1.9 10*3/uL (ref 0.7–4.0)
MCH: 30 pg (ref 26.0–34.0)
MCHC: 31.2 g/dL (ref 30.0–36.0)
MCV: 96.2 fL (ref 80.0–100.0)
Monocytes Absolute: 0.5 10*3/uL (ref 0.1–1.0)
Monocytes Relative: 5 %
Neutro Abs: 7 10*3/uL (ref 1.7–7.7)
Neutrophils Relative %: 74 %
Platelets: 482 10*3/uL — ABNORMAL HIGH (ref 150–400)
RBC: 4.2 MIL/uL (ref 3.87–5.11)
RDW: 12.4 % (ref 11.5–15.5)
WBC: 9.6 10*3/uL (ref 4.0–10.5)
nRBC: 0 % (ref 0.0–0.2)

## 2019-08-04 LAB — CULTURE, BLOOD (ROUTINE X 2)
Culture: NO GROWTH
Special Requests: ADEQUATE

## 2019-08-04 LAB — D-DIMER, QUANTITATIVE: D-Dimer, Quant: 0.38 ug/mL-FEU (ref 0.00–0.50)

## 2019-08-04 LAB — FERRITIN: Ferritin: 277 ng/mL (ref 11–307)

## 2019-08-04 LAB — C-REACTIVE PROTEIN: CRP: 1.4 mg/dL — ABNORMAL HIGH (ref <1.0)

## 2019-08-04 MED ORDER — METOPROLOL TARTRATE 25 MG PO TABS: 25.0000 mg | ORAL_TABLET | Freq: Two times a day (BID) | ORAL | 3 refills | Status: DC

## 2019-08-04 MED ORDER — IPRATROPIUM-ALBUTEROL 20-100 MCG/ACT IN AERS: 1.0000 | INHALATION_SPRAY | Freq: Three times a day (TID) | RESPIRATORY_TRACT | 3 refills | Status: DC

## 2019-08-04 MED ORDER — HYDROCOD POLST-CPM POLST ER 10-8 MG/5ML PO SUER: 5.0000 mL | Freq: Two times a day (BID) | ORAL | 0 refills | Status: DC | PRN

## 2019-08-04 MED ORDER — DEXAMETHASONE 6 MG PO TABS: 6.0000 mg | ORAL_TABLET | Freq: Every day | ORAL | 0 refills | Status: DC

## 2019-08-04 MED ORDER — ASCORBIC ACID 500 MG PO TABS: 500.0000 mg | ORAL_TABLET | Freq: Every day | ORAL | 0 refills | Status: DC

## 2019-08-04 MED ORDER — CLONAZEPAM 0.5 MG PO TABS: 0.5000 mg | ORAL_TABLET | Freq: Two times a day (BID) | ORAL | 0 refills | Status: DC

## 2019-08-04 MED ORDER — ZINC SULFATE 220 (50 ZN) MG PO CAPS: 220.0000 mg | ORAL_CAPSULE | Freq: Every day | ORAL | 0 refills | Status: DC

## 2019-08-04 MED ORDER — DEXAMETHASONE SODIUM PHOSPHATE 10 MG/ML IJ SOLN: 6.0000 mg | INTRAMUSCULAR | 0 refills | Status: DC

## 2019-08-04 NOTE — Progress Notes (Signed)
Discharge instructions and medication list reviewed. No questions at this time. Voiced understanding. IV taken out of L arm. Cardiac monitor removed. Husband coming to pick pt up for discharge.

## 2019-08-04 NOTE — Discharge Summary (Signed)
Physician Discharge Summary  Patient ID: Gabriela Stephens MRN: [id number] DOB/AGE: [birth year] 59 y.o.  Admit date: 08/01/2019 Discharge date: 08/04/2019  Admission Diagnoses:  Discharge Diagnoses:  Principal Problem:   Pneumonia due to COVID-19 virus Active Problems:   Multiple sclerosis Gabriela Stephens)   Discharged Condition: stable  Stephens Course:  Daley Riggan Washingtonis a 59 y.o.femalewith medical history significant ofmultiple sclerosis, who was diagnosed with Covid on January 12 following 7 days of fever chills myalgias cough and shortness of breath. Patient was doing fine at the time and was being treated conservatively at home. Symptoms however continued to get worse. She came back to the ER today with significant hypoxemia oxygen sats of 77% on room air. Patient was seen and evaluated here. She has shortness of breath with mild exertion. She also has ongoing cough. Mild fever. Chest x-ray consistent with bilateral infiltrates. Patient is being admitted with COVID-19 pneumonia with hypoxemia.. Patient has been maintaining greater than 90% saturation on room air. Inflammatory markers has remained low.  Patient received 4 days of remdesivir and is being discharged to continue with dexamethasone for 7 days, along with supportive therapy with vitamin C, D and zinc.  Plan Clinical problems list 1.  Acute hypoxic respiratory failure secondary to pneumonia due to COVID-19 virus infection 2.  Multiple sclerosis 3.  Anxiety/depression 4.  Hypokalemia 5.  Essential hypertension  1.  Acute hypoxic respiratory failure secondary to COVID-19 virus infection. Patient maintaining saturation above 90% on room air even with ambulation. Completed 4 days of remdesivir, discharged on dexamethasone 6 mg daily for 7 days, vitamin C, D and zinc. Patient was initially started on ceftriaxone and azithromycin.  Blood culture was negative and inflammatory markers were within normal  range. Antibiotics was discontinued. Continue with bronchodilators  2.  Multiple sclerosis.  Stable No intervention at this time  3.  Anxiety/depression Continue with Klonopin  Klonopin 0.5 mg twice daily  4.  Hypokalemia.  Resolved  5.  Essential hypertension Continue with amlodipine and metoprolol  Consults: None  Significant Diagnostic Studies: CBC, CMP, inflammatory markers all appear within normal range.  Blood culture was negative for any growth till date.  Chest x-ray showed bilateral hazy airspace disease.   Treatments: Remdesivir, dexamethasone, vitamin C, D and zinc   Discharge Exam: Blood pressure 130/74, pulse 81, temperature 98 F (36.7 C), temperature source Oral, resp. rate 16, height 5\' 1"  (1.549 m), weight 93 kg, last menstrual period 12/31/2011, SpO2 92 %. General appearance: alert and no distress Head: Normocephalic, without obvious abnormality, atraumatic Resp: clear to auscultation bilaterally GI: soft, non-tender; bowel sounds normal; no masses,  no organomegaly Extremities: extremities normal, atraumatic, no cyanosis or edema Skin: Skin color, texture, turgor normal. No rashes or lesions Neurologic: Alert and oriented X 3, normal strength and tone. Normal symmetric reflexes. Normal coordination and gait  Disposition: Discharge disposition: 01-Home or Self Care       Discharge Instructions    Diet - low sodium heart healthy   Complete by: As directed    Increase activity slowly   Complete by: As directed      Allergies as of 08/04/2019      Reactions   Gadolinium Other (See Comments)      Medication List    STOP taking these medications   promethazine-dextromethorphan 6.25-15 MG/5ML syrup Commonly known as: PROMETHAZINE-DM   zinc gluconate 50 MG tablet     TAKE these medications   albuterol 108 (90 Base) MCG/ACT inhaler Commonly known as:  VENTOLIN HFA Inhale 2 puffs into the lungs every 6 (six) hours as needed for wheezing or  shortness of breath.   amLODipine 5 MG tablet Commonly known as: NORVASC Take 5 mg by mouth daily.   ascorbic acid 500 MG tablet Commonly known as: VITAMIN C Take 1 tablet (500 mg total) by mouth daily. Start taking on: August 05, 2019 What changed:   medication strength  how much to take   chlorpheniramine-HYDROcodone 10-8 MG/5ML Suer Commonly known as: TUSSIONEX Take 5 mLs by mouth every 12 (twelve) hours as needed for cough.   clonazePAM 0.5 MG tablet Commonly known as: KLONOPIN Take 1 tablet (0.5 mg total) by mouth 2 (two) times daily.   dexamethasone 6 MG tablet Commonly known as: DECADRON Take 1 tablet (6 mg total) by mouth daily.   ELDERBERRY PO Take 1 lozenge by mouth every morning.   Ipratropium-Albuterol 20-100 MCG/ACT Aers respimat Commonly known as: COMBIVENT Inhale 1 puff into the lungs 3 (three) times daily.   metoprolol tartrate 25 MG tablet Commonly known as: LOPRESSOR Take 1 tablet (25 mg total) by mouth 2 (two) times daily.   multivitamin with minerals Tabs tablet Take 1 tablet by mouth daily.   Vitamin D 125 MCG (5000 UT) Caps Take 125 mcg by mouth daily.   zinc sulfate 220 (50 Zn) MG capsule Take 1 capsule (220 mg total) by mouth daily. Start taking on: August 05, 2019      Total time spent on this discharge encounter is 38 minutes  Signed: Elie Confer 08/04/2019, 1:47 PM

## 2019-08-06 LAB — CULTURE, BLOOD (ROUTINE X 2)
Culture: NO GROWTH
Culture: NO GROWTH
Special Requests: ADEQUATE

## 2019-08-20 ENCOUNTER — Telehealth: Payer: Self-pay | Admitting: Family Medicine

## 2019-08-20 NOTE — Telephone Encounter (Signed)
Pt called in stating that the Combivent is going to cost $493 with the coupon card. She wanted to now if Birdie Riddle would send in something similar to Combivent but cheaper. I did let pt know that we could send in something else but we wouldn't know the cost of the medication and it might benefit her to call her insurance company to get a list of inhalers that maybe cheaper.   Pt states she is continuing the Ventolin HFA is Birdie Riddle wants her to.   Pt can be reached at the home #

## 2019-08-21 NOTE — Telephone Encounter (Signed)
Patient notified of PCP recommendations and is agreement and expresses an understanding.  

## 2019-08-21 NOTE — Telephone Encounter (Signed)
Pt doesn't need to continue the Combivent.  She can use the Albuterol inhaler as needed but hopefully she is requiring this less and less

## 2019-08-21 NOTE — Telephone Encounter (Signed)
Please advise 

## 2019-09-02 ENCOUNTER — Telehealth: Payer: Self-pay

## 2019-09-02 DIAGNOSIS — Z1231 Encounter for screening mammogram for malignant neoplasm of breast: Secondary | ICD-10-CM | POA: Diagnosis not present

## 2019-09-02 DIAGNOSIS — Z01419 Encounter for gynecological examination (general) (routine) without abnormal findings: Secondary | ICD-10-CM | POA: Diagnosis not present

## 2019-09-02 DIAGNOSIS — Z6839 Body mass index (BMI) 39.0-39.9, adult: Secondary | ICD-10-CM | POA: Diagnosis not present

## 2019-09-02 NOTE — Telephone Encounter (Signed)
Called pt and left a detailed message to advise of PCP recommendations and also to advise that the Follow up appointment can be in office per PCP.

## 2019-09-02 NOTE — Telephone Encounter (Signed)
Patient called in stating that she was hospitalized for 4 days with Covid-19 in January 2021. She states the hospital put her on Ventolin 3 puffs daily and Metoprolol 25mg  BID. She states she is going to finish off the Ventolin, which has one refill. The metoprolol she states she stopped Saturday because she is on Amlodipine. She states she was at her GYN's office this morning and her bp was 148/80. She states she will recheck it later tonight. She would like to be advised on wether she should continue with these medications prescribed by there ER. Patient states the only symptoms she is still having is shortness of breath "every now and then."

## 2019-09-02 NOTE — Telephone Encounter (Signed)
We would need a f/u visit to determine if the Metoprolol was still needed as we would need to know how BP is behaving.  The Ventolin can be used as needed for cough or shortness of breath but doesn't need to be used regularly

## 2019-09-03 NOTE — Telephone Encounter (Signed)
Tried calling pt again today. Phone just rang.

## 2019-09-04 NOTE — Telephone Encounter (Signed)
Pt has been scheduled.  °

## 2019-09-06 ENCOUNTER — Encounter: Payer: Self-pay | Admitting: Family Medicine

## 2019-09-06 ENCOUNTER — Ambulatory Visit: Payer: BC Managed Care – PPO | Admitting: Family Medicine

## 2019-09-06 ENCOUNTER — Ambulatory Visit (INDEPENDENT_AMBULATORY_CARE_PROVIDER_SITE_OTHER): Payer: BC Managed Care – PPO | Admitting: Family Medicine

## 2019-09-06 ENCOUNTER — Other Ambulatory Visit: Payer: Self-pay

## 2019-09-06 VITALS — BP 130/80 | HR 97 | Ht 61.0 in | Wt 205.0 lb

## 2019-09-06 DIAGNOSIS — E669 Obesity, unspecified: Secondary | ICD-10-CM | POA: Diagnosis not present

## 2019-09-06 DIAGNOSIS — I10 Essential (primary) hypertension: Secondary | ICD-10-CM

## 2019-09-06 DIAGNOSIS — U071 COVID-19: Secondary | ICD-10-CM

## 2019-09-06 DIAGNOSIS — J1282 Pneumonia due to coronavirus disease 2019: Secondary | ICD-10-CM

## 2019-09-06 NOTE — Progress Notes (Signed)
Virtual Visit via Video   I connected with patient on 09/06/19 at 10:00 AM EST by a video enabled telemedicine application and verified that I am speaking with the correct person using two identifiers.  Location patient: Home Location provider: [facility], Office Persons participating in the virtual visit: Patient, Provider, [place] (Jess B)  I discussed the limitations of evaluation and management by telemedicine and the availability of in person appointments. The patient expressed understanding and agreed to proceed.  Subjective:   HPI:   HTN- new to provider, ongoing for pt.  On Amlodipine 5mg  and Metoprolol 25mg  daily.  Was started on meds by Neuro.  Denies CP, HAs, visual changes, edema.  Obesity- pt's BMI is 38.73  No regular exercise recently due to [place].  Hx of COVID- pt reports her energy level is better.  She continues to get 'winded' at times.  Is able to check O2 sats when she is winded but O2 remains in high 90s.  No cough.  ROS:   See pertinent positives and negatives per HPI.  Patient Active Problem List   Diagnosis Date Noted  . Pneumonia due to COVID-19 virus 08/01/2019  . Multiple sclerosis ([place]) 05/11/2017  . Conjunctivitis 06/19/2014  . Obesity (BMI 30-39.9) 06/05/2014  . Leiomyoma 01/27/2014  . Routine general medical examination at a health care facility 02/02/2012  . THYROMEGALY 07/26/2010  . SHOULDER, PAIN 01/06/2009    Social History   Tobacco Use  . Smoking status: Former Research scientist (life sciences)  . Smokeless tobacco: Never Used  Substance Use Topics  . Alcohol use: Yes    Comment: occasional wine    Current Outpatient Medications:  .  albuterol (VENTOLIN HFA) 108 (90 Base) MCG/ACT inhaler, Inhale 2 puffs into the lungs every 6 (six) hours as needed for wheezing or shortness of breath., Disp: 18 g, Rfl: 1 .  amLODipine (NORVASC) 5 MG tablet, Take 5 mg by mouth daily., Disp: , Rfl:  .  Ascorbic Acid (VITAMIN C) 1000 MG tablet, , Disp: , Rfl:  .   aspirin (ASPIRIN ADULT LOW DOSE) 81 MG EC tablet, , Disp: , Rfl:  .  Biotin 1000 MCG tablet, , Disp: , Rfl:  .  Cholecalciferol (VITAMIN D3) 50 MCG (2000 UT) capsule, , Disp: , Rfl:  .  metoprolol tartrate (LOPRESSOR) 25 MG tablet, Take 1 tablet (25 mg total) by mouth 2 (two) times daily. (Patient taking differently: Take 25 mg by mouth daily. ), Disp: 60 tablet, Rfl: 3 .  Multiple Vitamin (MULTIVITAMIN WITH MINERALS) TABS tablet, Take 1 tablet by mouth daily., Disp: , Rfl:  .  Zinc 50 MG CAPS, , Disp: , Rfl:   Allergies  Allergen Reactions  . Gadolinium Other (See Comments)    Objective:   BP 130/80   Pulse 97   Ht 5\' 1"  (1.549 m)   Wt 205 lb (93 kg)   LMP 12/31/2011   BMI 38.73 kg/m   AAOx3, NAD NCAT, EOMI No obvious CN deficits Coloring WNL Pt is able to speak clearly, coherently without shortness of breath or increased work of breathing.  Thought process is linear.  Mood is appropriate.   Assessment and Plan:   HTN- new to provider but ongoing for pt.  BP is adequately controlled on Amlodipine and once daily Metoprolol.  Continue current medications.  Will follow.  Obesity- ongoing issue for pt.  She has not been exercising regularly due to her recent bout w/ COVID and her subsequent hospitalization.  Encouraged her to  resume exercising as she is able and continue healthy food choices.  Will follow.  Hx of COVID- recovering at this time.  Continues to have some shortness of breath and fatigue but no longer has cough.  O2 levels are stable.  Will follow.   Annye Asa, MD 09/06/2019

## 2019-09-06 NOTE — Progress Notes (Signed)
I have discussed the procedure for the virtual visit with the patient who has given consent to proceed with assessment and treatment.   Isaish Alemu L Timonthy Hovater, CMA     

## 2019-09-11 ENCOUNTER — Telehealth: Payer: Self-pay | Admitting: Family Medicine

## 2019-09-11 NOTE — Telephone Encounter (Signed)
LM asking pt to call back to schedule a cpe in 41mths

## 2019-09-13 ENCOUNTER — Telehealth: Payer: Self-pay

## 2019-09-13 NOTE — Telephone Encounter (Signed)
It is a rather gray area but it is recommended you wait between 45-90 days for vaccination b/c of the natural immunity having the virus provides.  I would schedule closer to 45 so that the natural immunity doesn't wear off before you are able to get the vaccine

## 2019-09-13 NOTE — Telephone Encounter (Signed)
Spoke with patient. Patient voiced understanding.

## 2019-09-13 NOTE — Telephone Encounter (Signed)
Patient called in to find out when she can get the covid vaccine due to having covid 19 last month. Please advise.

## 2019-12-17 DIAGNOSIS — H1132 Conjunctival hemorrhage, left eye: Secondary | ICD-10-CM | POA: Diagnosis not present

## 2020-01-30 DIAGNOSIS — D225 Melanocytic nevi of trunk: Secondary | ICD-10-CM | POA: Diagnosis not present

## 2020-01-30 DIAGNOSIS — L905 Scar conditions and fibrosis of skin: Secondary | ICD-10-CM | POA: Diagnosis not present

## 2020-01-30 DIAGNOSIS — L821 Other seborrheic keratosis: Secondary | ICD-10-CM | POA: Diagnosis not present

## 2020-02-12 ENCOUNTER — Encounter: Payer: BC Managed Care – PPO | Admitting: Family Medicine

## 2020-02-20 ENCOUNTER — Telehealth: Payer: Self-pay | Admitting: Family Medicine

## 2020-02-20 MED ORDER — METOPROLOL TARTRATE 25 MG PO TABS: 25.0000 mg | ORAL_TABLET | Freq: Every day | ORAL | 1 refills | Status: DC

## 2020-02-20 NOTE — Telephone Encounter (Signed)
Needs refill of metoprolol - 25 mg. - would like it sent to [facility] on [facility] [address] [facility] [address].  - Last visit:  09/06/2019 - Next visit - September, 2021 for physical

## 2020-02-20 NOTE — Telephone Encounter (Signed)
Please advise, you haven't filled this medication. Per original SIG pt is to be taking BID, per note on Rx pt is taking daily. Also pt does not have an appt until next year for CPE. Should she come in for a BP check before then?

## 2020-02-20 NOTE — Telephone Encounter (Signed)
Medication filled to pharmacy as requested.   

## 2020-02-20 NOTE — Telephone Encounter (Signed)
She has appt scheduled for September so OK to fill.  She is only taking this once daily.  We can change script to reflect that.  1 tab daily, #90, 1 refill

## 2020-04-15 ENCOUNTER — Encounter: Payer: Self-pay | Admitting: Family Medicine

## 2020-04-15 ENCOUNTER — Other Ambulatory Visit: Payer: Self-pay

## 2020-04-15 ENCOUNTER — Ambulatory Visit (INDEPENDENT_AMBULATORY_CARE_PROVIDER_SITE_OTHER): Payer: BC Managed Care – PPO | Admitting: Family Medicine

## 2020-04-15 VITALS — BP 122/81 | HR 76 | Temp 98.6°F | Resp 16 | Ht 61.0 in | Wt 217.0 lb

## 2020-04-15 DIAGNOSIS — Z23 Encounter for immunization: Secondary | ICD-10-CM | POA: Diagnosis not present

## 2020-04-15 DIAGNOSIS — Z0000 Encounter for general adult medical examination without abnormal findings: Secondary | ICD-10-CM

## 2020-04-15 LAB — BASIC METABOLIC PANEL
BUN: 11 mg/dL (ref 6–23)
CO2: 29 mEq/L (ref 19–32)
Calcium: 10.2 mg/dL (ref 8.4–10.5)
Chloride: 104 mEq/L (ref 96–112)
Creatinine, Ser: 0.69 mg/dL (ref 0.40–1.20)
GFR: 105.41 mL/min (ref >60.00)
Glucose, Bld: 93 mg/dL (ref 70–99)
Potassium: 4.3 mEq/L (ref 3.5–5.1)
Sodium: 140 mEq/L (ref 135–145)

## 2020-04-15 LAB — HEPATIC FUNCTION PANEL
ALT: 18 U/L (ref 0–35)
AST: 22 U/L (ref 0–37)
Albumin: 4.5 g/dL (ref 3.5–5.2)
Alkaline Phosphatase: 105 U/L (ref 39–117)
Bilirubin, Direct: 0.1 mg/dL (ref 0.0–0.3)
Total Bilirubin: 0.3 mg/dL (ref 0.2–1.2)
Total Protein: 7.3 g/dL (ref 6.0–8.3)

## 2020-04-15 LAB — CBC WITH DIFFERENTIAL/PLATELET
Basophils Absolute: 0 10*3/uL (ref 0.0–0.1)
Basophils Relative: 0.6 % (ref 0.0–3.0)
Eosinophils Absolute: 0.2 10*3/uL (ref 0.0–0.7)
Eosinophils Relative: 3 % (ref 0.0–5.0)
HCT: 40.4 % (ref 36.0–46.0)
Hemoglobin: 13 g/dL (ref 12.0–15.0)
Lymphocytes Relative: 40.6 % (ref 12.0–46.0)
Lymphs Abs: 2.8 10*3/uL (ref 0.7–4.0)
MCHC: 32.2 g/dL (ref 30.0–36.0)
MCV: 92.5 fl (ref 78.0–100.0)
Monocytes Absolute: 0.4 10*3/uL (ref 0.1–1.0)
Monocytes Relative: 6.2 % (ref 3.0–12.0)
Neutro Abs: 3.4 10*3/uL (ref 1.4–7.7)
Neutrophils Relative %: 49.6 % (ref 43.0–77.0)
Platelets: 400 10*3/uL (ref 150.0–400.0)
RBC: 4.37 Mil/uL (ref 3.87–5.11)
RDW: 13.7 % (ref 11.5–15.5)
WBC: 6.8 10*3/uL (ref 4.0–10.5)

## 2020-04-15 LAB — LIPID PANEL
Cholesterol: 201 mg/dL — ABNORMAL HIGH (ref 0–200)
HDL: 56.4 mg/dL (ref >39.00)
LDL Cholesterol: 127 mg/dL — ABNORMAL HIGH (ref 0–99)
NonHDL: 145.05
Total CHOL/HDL Ratio: 4
Triglycerides: 88 mg/dL (ref 0.0–149.0)
VLDL: 17.6 mg/dL (ref 0.0–40.0)

## 2020-04-15 LAB — TSH: TSH: 0.62 u[IU]/mL (ref 0.35–4.50)

## 2020-04-15 NOTE — Assessment & Plan Note (Signed)
Deteriorated.  Pt has gained 12 lbs since last visit.  Discussed need for healthy diet and regular activity.  Check labs to risk stratify.  Will follow.

## 2020-04-15 NOTE — Assessment & Plan Note (Signed)
Pt's PE WNL w/ exception of obesity.  UTD on mammo, colonoscopy, Tdap, COVID vaccines.  Flu shot given today.  Check labs.  Anticipatory guidance provided.

## 2020-04-15 NOTE — Progress Notes (Signed)
   Subjective:    Patient ID: Gabriela Stephens, female    DOB: 02-Feb-1961, 59 y.o.   MRN: [id number]  HPI CPE- UTD on mammo, colonoscopy.  UTD on COVID vaccines.  Tdap 'less than 10 yrs ago'.  No need for pap due to hysterectomy.  Pt has gained 12 lbs since last visit  Reviewed past medical, surgical, family and social histories.   Health Maintenance  Topic Date Due  . COVID-19 Vaccine (1) Never done  . INFLUENZA VACCINE  02/16/2020  . Hepatitis C Screening  07/30/2020 (Originally 1960-12-22)  . MAMMOGRAM  06/15/2021  . TETANUS/TDAP  02/01/2022  . PAP SMEAR-Modifier  06/15/2022  . COLONOSCOPY  06/11/2023  . HIV Screening  Completed      Review of Systems Patient reports no vision/ hearing changes, adenopathy,fever, persistant/recurrent hoarseness , swallowing issues, chest pain, palpitations, edema, persistant/recurrent cough, hemoptysis, dyspnea (rest/exertional/paroxysmal nocturnal), gastrointestinal bleeding (melena, rectal bleeding), abdominal pain, significant heartburn, bowel changes, GU symptoms (dysuria, hematuria, incontinence), Gyn symptoms (abnormal  bleeding, pain),  syncope, memory loss, numbness & tingling, skin/hair/nail changes, abnormal bruising or bleeding, anxiety, or depression.   + leg weakness at times due to MS  This visit occurred during the SARS-CoV-2 public health emergency.  Safety protocols were in place, including screening questions prior to the visit, additional usage of staff PPE, and extensive cleaning of exam room while observing appropriate contact time as indicated for disinfecting solutions.       Objective:   Physical Exam General Appearance:    Alert, cooperative, no distress, appears stated age  Head:    Normocephalic, without obvious abnormality, atraumatic  Eyes:    PERRL, conjunctiva/corneas clear, EOM's intact, fundi    benign, both eyes  Ears:    Normal TM's and external ear canals, both ears  Nose:   Deferred due to COVID    Throat:   Neck:   Supple, symmetrical, trachea midline, no adenopathy;    Thyroid: no enlargement/tenderness/nodules  Back:     Symmetric, no curvature, ROM normal, no CVA tenderness  Lungs:     Clear to auscultation bilaterally, respirations unlabored  Chest Wall:    No tenderness or deformity   Heart:    Regular rate and rhythm, S1 and S2 normal, no murmur, rub   or gallop  Breast Exam:    Deferred to GYN  Abdomen:     Soft, non-tender, bowel sounds active all four quadrants,    no masses, no organomegaly  Genitalia:    Deferred to GYN  Rectal:    Extremities:   Extremities normal, atraumatic, no cyanosis or edema  Pulses:   2+ and symmetric all extremities  Skin:   Skin color, texture, turgor normal, no rashes or lesions  Lymph nodes:   Cervical, supraclavicular, and axillary nodes normal  Neurologic:   CNII-XII intact, normal strength, sensation and reflexes    throughout          Assessment & Plan:

## 2020-04-15 NOTE — Patient Instructions (Addendum)
Follow up in 6 months to recheck BP We'll notify you of your lab results and make any changes if needed Continue to work on healthy diet and regular exercise- you can do it! Call with any questions or concerns Stay Safe!  Stay Healthy!!

## 2020-05-18 ENCOUNTER — Telehealth: Payer: Self-pay | Admitting: Family Medicine

## 2020-05-18 MED ORDER — AMLODIPINE BESYLATE 5 MG PO TABS: 5.0000 mg | ORAL_TABLET | Freq: Every day | ORAL | 1 refills | Status: DC

## 2020-05-18 NOTE — Telephone Encounter (Signed)
Medication filled to pharmacy as requested.   

## 2020-05-18 NOTE — Telephone Encounter (Signed)
..  Medication Refills  Medication:  Norvasc 5mg  Pharmacy:  [place].  [place]  ** Let patient know to contact pharmacy at the end of the day to make sure medication is ready.**  ** Please notify patient to allow 48-72 hours to process.**  ** Encourage patient to contact the pharmacy for refills or they can request refills through [facility]**  Clinical Fills out below:   Last refill:  QTY:  Refill Date:    Other Comments:   Okay for refill?  Please advise.

## 2020-06-03 ENCOUNTER — Other Ambulatory Visit: Payer: Self-pay

## 2020-06-03 ENCOUNTER — Ambulatory Visit (INDEPENDENT_AMBULATORY_CARE_PROVIDER_SITE_OTHER): Payer: BC Managed Care – PPO | Admitting: Family Medicine

## 2020-06-03 ENCOUNTER — Encounter: Payer: Self-pay | Admitting: Family Medicine

## 2020-06-03 VITALS — BP 122/74 | HR 61 | Temp 97.4°F | Resp 17 | Ht 61.0 in | Wt 218.0 lb

## 2020-06-03 DIAGNOSIS — R2241 Localized swelling, mass and lump, right lower limb: Secondary | ICD-10-CM | POA: Diagnosis not present

## 2020-06-03 DIAGNOSIS — M5431 Sciatica, right side: Secondary | ICD-10-CM

## 2020-06-03 MED ORDER — PREDNISONE 10 MG PO TABS: ORAL_TABLET | ORAL | 0 refills | Status: DC

## 2020-06-03 NOTE — Patient Instructions (Signed)
Follow up by phone or MyChart if symptoms change, worsen, or fail to improve START the Prednisone as directed.  Take w/ food. HEAT for the sciatica ICE for the swelling We'll call you with your [place] appt Try and wrap the swelling with an ace wrap to see if compression improves things Call with any questions or concerns Hang in there!

## 2020-06-03 NOTE — Progress Notes (Signed)
   Subjective:    Patient ID: Gabriela Stephens, female    DOB: 1960-09-20, 59 y.o.   MRN: [id number]  HPI R leg pain- pain will start in low back/buttock and radiate down the back of the leg to the knee.  Pain will shoot down leg like a burning pain and has associated muscle ache.  Sxs started 'a couple of weeks ago'.  Started Aleve and ES Tylenol w/ relief.  Pain is worse first thing in the AM.  No change in routine prior to onset of pain.  Denies weakness, numbness, bowel/bladder incontinence.  Has area of leg swelling just below R knee and medial to tibia.  Swelling is not warm, red, or painful.  No pain.     Review of Systems For ROS see HPI   This visit occurred during the SARS-CoV-2 public health emergency.  Safety protocols were in place, including screening questions prior to the visit, additional usage of staff PPE, and extensive cleaning of exam room while observing appropriate contact time as indicated for disinfecting solutions.       Objective:   Physical Exam Vitals reviewed.  Constitutional:      General: She is not in acute distress.    Appearance: Normal appearance. She is obese. She is not ill-appearing.  HENT:     Head: Normocephalic and atraumatic.  Cardiovascular:     Pulses: Normal pulses.  Musculoskeletal:     Right lower leg: Edema (localized area of soft tissue swelling just below knee and medial to tibia, no TTP, no overlying firmness, erythema, or warmth) present.     Left lower leg: No edema.  Skin:    General: Skin is warm and dry.     Findings: No erythema.  Neurological:     General: No focal deficit present.     Mental Status: She is alert and oriented to person, place, and time.     Cranial Nerves: No cranial nerve deficit.     Coordination: Coordination normal.     Comments: + SLR on R, negative on L  Psychiatric:        Mood and Affect: Mood normal.        Behavior: Behavior normal.        Thought Content: Thought content normal.            Assessment & Plan:  Sciatica- new.  R sided.  Pt's shooting pain starting in her buttock and traveling to her knee is consistent w/ sciatica.  Start Prednisone to improve pain.  Reviewed supportive care and red flags that should prompt return.  Pt expressed understanding and is in agreement w/ plan.   Localized swelling- new.  Not consistent w/ DVT- not firm, not tender, not red.  Area is very soft but noticeably larger than other leg.  Will get [place] to assess.  Encouraged ice, compression.  Pt expressed understanding and is in agreement w/ plan.

## 2020-06-08 ENCOUNTER — Other Ambulatory Visit (HOSPITAL_BASED_OUTPATIENT_CLINIC_OR_DEPARTMENT_OTHER): Payer: BC Managed Care – PPO

## 2020-06-10 ENCOUNTER — Ambulatory Visit (HOSPITAL_COMMUNITY)
Admission: RE | Admit: 2020-06-10 | Discharge: 2020-06-10 | Disposition: A | Discharge status: Home / Self Care | Payer: BC Managed Care – PPO | Source: Ambulatory Visit | Attending: Family Medicine | Admitting: Family Medicine

## 2020-06-10 ENCOUNTER — Other Ambulatory Visit: Payer: Self-pay

## 2020-06-10 DIAGNOSIS — M7989 Other specified soft tissue disorders: Secondary | ICD-10-CM | POA: Diagnosis not present

## 2020-06-10 DIAGNOSIS — R2241 Localized swelling, mass and lump, right lower limb: Secondary | ICD-10-CM | POA: Diagnosis not present

## 2020-07-03 DIAGNOSIS — H5213 Myopia, bilateral: Secondary | ICD-10-CM | POA: Diagnosis not present

## 2020-07-18 DIAGNOSIS — U071 COVID-19: Secondary | ICD-10-CM

## 2020-07-18 HISTORY — DX: COVID-19: U07.1

## 2020-08-17 ENCOUNTER — Other Ambulatory Visit: Payer: Self-pay | Admitting: *Deleted

## 2020-08-17 MED ORDER — METOPROLOL TARTRATE 25 MG PO TABS: 25.0000 mg | ORAL_TABLET | Freq: Every day | ORAL | 1 refills | Status: DC

## 2020-09-28 ENCOUNTER — Other Ambulatory Visit: Payer: Self-pay

## 2020-09-28 ENCOUNTER — Encounter: Payer: Self-pay | Admitting: Family Medicine

## 2020-09-28 ENCOUNTER — Telehealth (INDEPENDENT_AMBULATORY_CARE_PROVIDER_SITE_OTHER): Payer: BC Managed Care – PPO | Admitting: Family Medicine

## 2020-09-28 DIAGNOSIS — R194 Change in bowel habit: Secondary | ICD-10-CM | POA: Insufficient documentation

## 2020-09-28 DIAGNOSIS — R141 Gas pain: Secondary | ICD-10-CM | POA: Insufficient documentation

## 2020-09-28 DIAGNOSIS — R143 Flatulence: Secondary | ICD-10-CM | POA: Insufficient documentation

## 2020-09-28 DIAGNOSIS — R142 Eructation: Secondary | ICD-10-CM | POA: Insufficient documentation

## 2020-09-28 DIAGNOSIS — L299 Pruritus, unspecified: Secondary | ICD-10-CM | POA: Diagnosis not present

## 2020-09-28 DIAGNOSIS — K5900 Constipation, unspecified: Secondary | ICD-10-CM | POA: Insufficient documentation

## 2020-09-28 MED ORDER — HYDROXYZINE HCL 10 MG PO TABS: 10.0000 mg | ORAL_TABLET | Freq: Three times a day (TID) | ORAL | 0 refills | Status: DC | PRN

## 2020-09-28 NOTE — Progress Notes (Signed)
Virtual Visit via Video   I connected with patient on 09/28/20 at  2:30 PM EDT by a video enabled telemedicine application and verified that I am speaking with the correct person using two identifiers.  Location patient: Home Location provider: [facility], Office Persons participating in the virtual visit: Patient, Provider, [place] (Sabrina M)  I discussed the limitations of evaluation and management by telemedicine and the availability of in person appointments. The patient expressed understanding and agreed to proceed.  Subjective:   HPI:   Itching- 'my skin is itching all over'.  Has been taking Benadryl for the last 3-4 days.  After 3 hrs, benadryl wears off and itching starts again.  No changes in the last 3-4 days- no new medications, soaps, lotions.  Son was sent to [place] 2/19, but over the last week he has not been able to communicate regularly.  This is understandably upsetting for her as the situation over there worsens.  No visible rashes or hives.  ROS:   See pertinent positives and negatives per HPI.  Patient Active Problem List   Diagnosis Date Noted  . Change in bowel habit 09/28/2020  . Constipation 09/28/2020  . Flatulence, eructation and gas pain 09/28/2020  . HTN (hypertension) 09/06/2019  . Pneumonia due to COVID-19 virus 08/01/2019  . Multiple sclerosis ([place]) 05/11/2017  . Morbid obesity ([place]) 06/05/2014  . Leiomyoma 01/27/2014  . Routine general medical examination at a health care facility 02/02/2012  . THYROMEGALY 07/26/2010  . SHOULDER, PAIN 01/06/2009    Social History   Tobacco Use  . Smoking status: Former Research scientist (life sciences)  . Smokeless tobacco: Never Used  Substance Use Topics  . Alcohol use: Yes    Comment: occasional wine    Current Outpatient Medications:  .  amLODipine (NORVASC) 5 MG tablet, Take 1 tablet (5 mg total) by mouth daily., Disp: 90 tablet, Rfl: 1 .  Ascorbic Acid (VITAMIN C) 1000 MG tablet, , Disp: , Rfl:  .  aspirin 81 MG  EC tablet, , Disp: , Rfl:  .  Biotin 1000 MCG tablet, , Disp: , Rfl:  .  Cholecalciferol (VITAMIN D3) 50 MCG (2000 UT) capsule, , Disp: , Rfl:  .  metoprolol tartrate (LOPRESSOR) 25 MG tablet, Take 1 tablet (25 mg total) by mouth daily., Disp: 90 tablet, Rfl: 1 .  Multiple Vitamin (MULTIVITAMIN WITH MINERALS) TABS tablet, Take 1 tablet by mouth daily., Disp: , Rfl:  .  Zinc 50 MG CAPS, , Disp: , Rfl:  .  predniSONE (DELTASONE) 10 MG tablet, 3 tabs x3 days and then 2 tabs x3 days and then 1 tab x3 days.  Take w/ food. (Patient not taking: Reported on 09/28/2020), Disp: 18 tablet, Rfl: 0 .  Probiotic Product (PROBIOTIC COLON SUPPORT) CAPS, , Disp: , Rfl:   Allergies  Allergen Reactions  . Gadolinium Other (See Comments)    Objective:   LMP 12/31/2011  AAOx3, NAD NCAT, EOMI No obvious CN deficits Coloring WNL Pt is able to speak clearly, coherently without shortness of breath or increased work of breathing.  Thought process is linear.  Mood is appropriate.   Assessment and Plan:   Itching- most likely stress related as her son is now stationed in the [place] and she is having less and less contact w/ him.  Sxs improve during the day when she's distracted, but worsen when she has free time.  Encouraged her to start a daily antihistamine (Claritin or Zyrtec) and will add Hydroxyzine as needed.  If sxs don't improve, pt will come in for lab visit to assess for underlying cause.  Pt expressed understanding and is in agreement w/ plan.    Annye Asa, MD 09/28/2020

## 2020-09-28 NOTE — Progress Notes (Signed)
I connected with  Gabriela Stephens on 09/28/20 by a video enabled telemedicine application and verified that I am speaking with the correct person using two identifiers.   I discussed the limitations of evaluation and management by telemedicine. The patient expressed understanding and agreed to proceed.

## 2020-10-13 ENCOUNTER — Ambulatory Visit: Payer: BC Managed Care – PPO | Admitting: Family Medicine

## 2020-10-14 DIAGNOSIS — Z01419 Encounter for gynecological examination (general) (routine) without abnormal findings: Secondary | ICD-10-CM | POA: Diagnosis not present

## 2020-10-14 DIAGNOSIS — Z1231 Encounter for screening mammogram for malignant neoplasm of breast: Secondary | ICD-10-CM | POA: Diagnosis not present

## 2020-10-14 DIAGNOSIS — Z6841 Body Mass Index (BMI) 40.0 and over, adult: Secondary | ICD-10-CM | POA: Diagnosis not present

## 2020-10-29 ENCOUNTER — Telehealth: Payer: Self-pay | Admitting: Family Medicine

## 2020-10-29 MED ORDER — SCOPOLAMINE 1 MG/3DAYS TD PT72: 1.0000 | MEDICATED_PATCH | TRANSDERMAL | 1 refills | Status: DC

## 2020-10-29 NOTE — Telephone Encounter (Signed)
Prescription sent.  Have a great time!

## 2020-10-29 NOTE — Telephone Encounter (Signed)
Patient would like a prescription for motion sickness - she is going on a cruise this Monday - Please advise - [facility] on [facility]

## 2020-10-30 DIAGNOSIS — Z20822 Contact with and (suspected) exposure to covid-19: Secondary | ICD-10-CM | POA: Diagnosis not present

## 2020-11-14 ENCOUNTER — Encounter: Payer: Self-pay | Admitting: Emergency Medicine

## 2020-11-14 ENCOUNTER — Other Ambulatory Visit: Payer: Self-pay

## 2020-11-14 ENCOUNTER — Ambulatory Visit
Admission: EM | Admit: 2020-11-14 | Discharge: 2020-11-14 | Disposition: A | Discharge status: Home / Self Care | Payer: BC Managed Care – PPO | Attending: Emergency Medicine | Admitting: Emergency Medicine

## 2020-11-14 DIAGNOSIS — L299 Pruritus, unspecified: Secondary | ICD-10-CM | POA: Diagnosis not present

## 2020-11-14 MED ORDER — HYDROXYZINE HCL 25 MG PO TABS: 25.0000 mg | ORAL_TABLET | Freq: Four times a day (QID) | ORAL | 0 refills | Status: DC

## 2020-11-14 NOTE — Discharge Instructions (Signed)
Please start daily cetirizine/Zyrtec or loratadine/Claritin Hydroxyzine as needed for further itching Continue to keep skin moisturized Follow-up if developing any rash or symptoms changing/worsening

## 2020-11-14 NOTE — ED Provider Notes (Signed)
[facility]    CSN: [id number] Arrival date & time: 11/14/20  1231      History   Chief Complaint Chief Complaint  Patient presents with  . Rash    HPI Gabriela Stephens is a 60 y.o. female history of MS, hypertension, presenting today for evaluation of skin irritation.  Reports today developed burning sensation and itching sensation to upper arms and upper chest and lower abdomen.  Denies associated rash.  Symptoms slightly subsided with use of Benadryl.  Reports similar episode approximately 1 month ago which lasted approximately 1 day.  Denies rash at that time either.  Denies any new changes in soaps lotions detergents, foods, medicines.  She expresses concern that this could be potentially related to her MS.  Denies any difficulty breathing.  HPI  Past Medical History:  Diagnosis Date  . Multiple sclerosis ([place]) 03/1992    Patient Active Problem List   Diagnosis Date Noted  . Change in bowel habit 09/28/2020  . Constipation 09/28/2020  . Flatulence, eructation and gas pain 09/28/2020  . HTN (hypertension) 09/06/2019  . Pneumonia due to COVID-19 virus 08/01/2019  . Multiple sclerosis ([place]) 05/11/2017  . Morbid obesity ([place]) 06/05/2014  . Leiomyoma 01/27/2014  . Routine general medical examination at a health care facility 02/02/2012  . THYROMEGALY 07/26/2010  . SHOULDER, PAIN 01/06/2009    Past Surgical History:  Procedure Laterality Date  . KNEE CARTILAGE SURGERY    . LAPAROSCOPIC ASSISTED VAGINAL HYSTERECTOMY Bilateral 01/27/2014   Procedure: LAPAROSCOPIC ASSISTED VAGINAL HYSTERECTOMY, BILATERAL SALPINGECTOMY;  Surgeon: Margarette Asal, MD;  Location: [facility];  Service: Gynecology;  Laterality: Bilateral;  . TUBAL LIGATION  1986    OB History   No obstetric history on file.      Home Medications    Prior to Admission medications   Medication Sig Start Date End Date Taking? Authorizing Provider  hydrOXYzine (ATARAX/VISTARIL) 25 MG tablet  Take 1 tablet (25 mg total) by mouth every 6 (six) hours. 11/14/20  Yes Kathalene Sporer C, PA-C  amLODipine (NORVASC) 5 MG tablet Take 1 tablet (5 mg total) by mouth daily. 05/18/20   Midge Minium, MD  Ascorbic Acid (VITAMIN C) 1000 MG tablet     [provider]  aspirin 81 MG EC tablet     [provider]  Biotin 1000 MCG tablet     [provider]  Cholecalciferol (VITAMIN D3) 50 MCG (2000 UT) capsule     [provider]  metoprolol tartrate (LOPRESSOR) 25 MG tablet Take 1 tablet (25 mg total) by mouth daily. 08/17/20   Midge Minium, MD  Multiple Vitamin (MULTIVITAMIN WITH MINERALS) TABS tablet Take 1 tablet by mouth daily.    [provider]  Probiotic Product (PROBIOTIC COLON SUPPORT) CAPS  04/14/20   [provider]  scopolamine (TRANSDERM-SCOP) 1 MG/3DAYS Place 1 patch (1.5 mg total) onto the skin every 3 (three) days. 10/29/20   Midge Minium, MD  Zinc 50 MG CAPS     [provider]    Family History History reviewed. No pertinent family history.  Social History Social History   Tobacco Use  . Smoking status: Former Research scientist (life sciences)  . Smokeless tobacco: Never Used  Vaping Use  . Vaping Use: Never used  Substance Use Topics  . Alcohol use: Yes    Comment: occasional wine  . Drug use: No     Allergies   Gadolinium   Review of Systems Review of Systems  Constitutional: Negative for fatigue and fever.  HENT: Negative for mouth sores.   Eyes: Negative for visual disturbance.  Respiratory: Negative for shortness of breath.   Cardiovascular: Negative for chest pain.  Gastrointestinal: Negative for abdominal pain, nausea and vomiting.  Genitourinary: Negative for genital sores.  Musculoskeletal: Negative for arthralgias and joint swelling.  Skin: Positive for color change and rash. Negative for wound.  Neurological: Negative for dizziness, weakness, light-headedness and headaches.     Physical  Exam Triage Vital Signs ED Triage Vitals  Enc Vitals Group     BP      Pulse      Resp      Temp      Temp src      SpO2      Weight      Height      Head Circumference      Peak Flow      Pain Score      Pain Loc      Pain Edu?      Excl. in [place]?    No data found.  Updated Vital Signs BP (!) 190/82 (BP Location: Left Arm)   Pulse 82   Temp 98.1 F (36.7 C) (Oral)   Resp 18   LMP 12/31/2011   SpO2 98%   Visual Acuity Right Eye Distance:   Left Eye Distance:   Bilateral Distance:    Right Eye Near:   Left Eye Near:    Bilateral Near:     Physical Exam Vitals and nursing note reviewed.  Constitutional:      Appearance: She is well-developed.     Comments: No acute distress  HENT:     Head: Normocephalic and atraumatic.     Nose: Nose normal.  Eyes:     Conjunctiva/sclera: Conjunctivae normal.  Cardiovascular:     Rate and Rhythm: Normal rate.  Pulmonary:     Effort: Pulmonary effort is normal. No respiratory distress.  Abdominal:     General: There is no distension.  Musculoskeletal:        General: Normal range of motion.     Cervical back: Neck supple.     Comments: Full active range of motion of extremities  Skin:    General: Skin is warm and dry.     Comments: No rash noted, slight erythema noted to distal upper arms near elbows  Neurological:     Mental Status: She is alert and oriented to person, place, and time.      UC Treatments / Results  Labs (all labs ordered are listed, but only abnormal results are displayed) Labs Reviewed - No data to display  EKG   Radiology No results found.  Procedures Procedures (including critical care time)  Medications Ordered in UC Medications - No data to display  Initial Impression / Assessment and Plan / UC Course  I have reviewed the triage vital signs and the nursing notes.  Pertinent labs & imaging results that were available during my care of the patient were reviewed by me and  considered in my medical decision making (see chart for details).     Pruritus of unclear etiology-no obvious rash associated with symptoms, improving with antihistamines, no clear triggers or changes within lifestyle recently.  Recommending to continue antihistamines, provided hydroxyzine to use as needed.  Continue to monitor, stay hydrated, moisturize,Discussed strict return precautions. Patient verbalized understanding and is agreeable with plan.  Final Clinical Impressions(s) / UC Diagnoses   Final  diagnoses:  Pruritus     Discharge Instructions     Please start daily cetirizine/Zyrtec or loratadine/Claritin Hydroxyzine as needed for further itching Continue to keep skin moisturized Follow-up if developing any rash or symptoms changing/worsening    ED Prescriptions    Medication Sig Dispense Auth. Provider   hydrOXYzine (ATARAX/VISTARIL) 25 MG tablet Take 1 tablet (25 mg total) by mouth every 6 (six) hours. 20 tablet Revin Corker, [place] C, PA-C     PDMP not reviewed this encounter.   Janith Lima, [place] 11/14/20 1339

## 2020-11-14 NOTE — ED Triage Notes (Signed)
Pt here for rash with itching and burning to bilateral upper arms started while shopping; pt sts episode x 1 prior that was similar; pt sts took benadryl PTA

## 2020-11-27 ENCOUNTER — Emergency Department (HOSPITAL_COMMUNITY)
Admission: EM | Admit: 2020-11-27 | Discharge: 2020-11-27 | Disposition: A | Discharge status: Home / Self Care | Payer: BC Managed Care – PPO | Attending: Emergency Medicine | Admitting: Emergency Medicine

## 2020-11-27 ENCOUNTER — Other Ambulatory Visit: Payer: Self-pay

## 2020-11-27 ENCOUNTER — Encounter (HOSPITAL_COMMUNITY): Payer: Self-pay

## 2020-11-27 DIAGNOSIS — Z79899 Other long term (current) drug therapy: Secondary | ICD-10-CM | POA: Diagnosis not present

## 2020-11-27 DIAGNOSIS — I10 Essential (primary) hypertension: Secondary | ICD-10-CM | POA: Insufficient documentation

## 2020-11-27 DIAGNOSIS — R202 Paresthesia of skin: Secondary | ICD-10-CM | POA: Diagnosis not present

## 2020-11-27 DIAGNOSIS — Z87891 Personal history of nicotine dependence: Secondary | ICD-10-CM | POA: Insufficient documentation

## 2020-11-27 DIAGNOSIS — Z7982 Long term (current) use of aspirin: Secondary | ICD-10-CM | POA: Diagnosis not present

## 2020-11-27 DIAGNOSIS — Z8616 Personal history of COVID-19: Secondary | ICD-10-CM | POA: Diagnosis not present

## 2020-11-27 MED ORDER — PREDNISONE 10 MG (21) PO TBPK: ORAL_TABLET | Freq: Every day | ORAL | 0 refills | Status: DC

## 2020-11-27 NOTE — Discharge Instructions (Addendum)
Take prednisone as prescribed. Follow-up with your neurologist for further evaluation and treatment.

## 2020-11-27 NOTE — ED Provider Notes (Signed)
[facility] DEPT Provider Note   CSN: [id number] Arrival date & time: 11/27/20  1351     History No chief complaint on file.   Gabriela Stephens is a 60 y.o. female.  60 year old female with history of MS presents with complaint of a burning sensation to her triceps area of both arms as well as occasionally her left lower abdomen.  Patient states this has been intermittent for the past several weeks, went to urgent care and was given prednisone and Zyrtec with an OTC medication that seemed to help however she stopped those medications and her symptoms returned.  Patient called her neurologist however has not been seen since before COVID and required a new referral so she called her PCP office who advised her to come to the emergency room.  Denies weakness in her arms.  No other complaints or concerns.        Past Medical History:  Diagnosis Date  . Multiple sclerosis ([place]) 03/1992    Patient Active Problem List   Diagnosis Date Noted  . Change in bowel habit 09/28/2020  . Constipation 09/28/2020  . Flatulence, eructation and gas pain 09/28/2020  . HTN (hypertension) 09/06/2019  . Pneumonia due to COVID-19 virus 08/01/2019  . Multiple sclerosis ([place]) 05/11/2017  . Morbid obesity ([place]) 06/05/2014  . Leiomyoma 01/27/2014  . Routine general medical examination at a health care facility 02/02/2012  . THYROMEGALY 07/26/2010  . SHOULDER, PAIN 01/06/2009    Past Surgical History:  Procedure Laterality Date  . KNEE CARTILAGE SURGERY    . LAPAROSCOPIC ASSISTED VAGINAL HYSTERECTOMY Bilateral 01/27/2014   Procedure: LAPAROSCOPIC ASSISTED VAGINAL HYSTERECTOMY, BILATERAL SALPINGECTOMY;  Surgeon: Margarette Asal, MD;  Location: [facility];  Service: Gynecology;  Laterality: Bilateral;  . TUBAL LIGATION  1986     OB History   No obstetric history on file.     Family History  Problem Relation Age of Onset  . Alzheimer's disease Mother   . ALS  Father     Social History   Tobacco Use  . Smoking status: Former Research scientist (life sciences)  . Smokeless tobacco: Never Used  Vaping Use  . Vaping Use: Never used  Substance Use Topics  . Alcohol use: Not Currently    Comment: occasional wine  . Drug use: No    Home Medications Prior to Admission medications   Medication Sig Start Date End Date Taking? Authorizing Provider  predniSONE (STERAPRED UNI-PAK 21 TAB) 10 MG (21) TBPK tablet Take by mouth daily. Take 6 tabs by mouth daily  for 2 days, then 5 tabs for 2 days, then 4 tabs for 2 days, then 3 tabs for 2 days, 2 tabs for 2 days, then 1 tab by mouth daily for 2 days 11/27/20  Yes Tacy Learn, PA-C  amLODipine (NORVASC) 5 MG tablet Take 1 tablet (5 mg total) by mouth daily. 05/18/20   Midge Minium, MD  Ascorbic Acid (VITAMIN C) 1000 MG tablet     [provider]  aspirin 81 MG EC tablet     [provider]  Biotin 1000 MCG tablet     [provider]  Cholecalciferol (VITAMIN D3) 50 MCG (2000 UT) capsule     [provider]  hydrOXYzine (ATARAX/VISTARIL) 25 MG tablet Take 1 tablet (25 mg total) by mouth every 6 (six) hours. 11/14/20   Wieters, Hallie C, PA-C  metoprolol tartrate (LOPRESSOR) 25 MG tablet Take 1 tablet (25 mg total) by mouth daily. 08/17/20  Midge Minium, MD  Multiple Vitamin (MULTIVITAMIN WITH MINERALS) TABS tablet Take 1 tablet by mouth daily.    [provider]  Probiotic Product (PROBIOTIC COLON SUPPORT) CAPS  04/14/20   [provider]  scopolamine (TRANSDERM-SCOP) 1 MG/3DAYS Place 1 patch (1.5 mg total) onto the skin every 3 (three) days. 10/29/20   Midge Minium, MD  Zinc 50 MG CAPS     [provider]    Allergies    Gadolinium  Review of Systems   Review of Systems  Constitutional: Negative for fever.  Respiratory: Negative for shortness of breath.   Cardiovascular: Negative for chest pain.  Musculoskeletal: Negative for arthralgias,  myalgias, neck pain and neck stiffness.  Skin: Negative for rash and wound.  Allergic/Immunologic: Positive for immunocompromised state.  Neurological: Negative for weakness and numbness.  Hematological: Negative for adenopathy.  Psychiatric/Behavioral: Negative for confusion.  All other systems reviewed and are negative.   Physical Exam Updated Vital Signs BP (!) 158/82 (BP Location: Left Arm)   Pulse 74   Temp 97.8 F (36.6 C) (Oral)   Resp 18   Ht 5\' 1"  (1.549 m)   Wt 98.9 kg   LMP 12/31/2011   SpO2 100%   BMI 41.19 kg/m   Physical Exam Vitals and nursing note reviewed.  Constitutional:      General: She is not in acute distress.    Appearance: She is well-developed. She is not diaphoretic.  HENT:     Head: Normocephalic and atraumatic.  Cardiovascular:     Pulses: Normal pulses.  Pulmonary:     Effort: Pulmonary effort is normal.  Musculoskeletal:        General: No swelling, tenderness, deformity or signs of injury. Normal range of motion.     Cervical back: Normal range of motion and neck supple. No tenderness.  Skin:    General: Skin is warm and dry.     Findings: No erythema or rash.  Neurological:     Mental Status: She is alert and oriented to person, place, and time.     Sensory: No sensory deficit.     Motor: No weakness.  Psychiatric:        Behavior: Behavior normal.     ED Results / Procedures / Treatments   Labs (all labs ordered are listed, but only abnormal results are displayed) Labs Reviewed - No data to display  EKG None  Radiology No results found.  Procedures Procedures   Medications Ordered in ED Medications - No data to display  ED Course  I have reviewed the triage vital signs and the nursing notes.  Pertinent labs & imaging results that were available during my care of the patient were reviewed by me and considered in my medical decision making (see chart for details).  Clinical Course as of 11/27/20 1518  Fri Nov 28, 6146  1475 60 year old female with complaint of a burning sensation to bilateral triceps area as well as left lower abdominal area, intermittent for the past few weeks.  Improved temporarily with Benadryl and Zyrtec.  Discussed with Dr. Francia Greaves, ED attending, agrees with plan for course of prednisone and follow-up with her neurologist. [LM]    Clinical Course User Index [LM] Roque Lias   MDM Rules/Calculators/A&P                          Final Clinical Impression(s) / ED Diagnoses Final diagnoses:  Paresthesia  Rx / DC Orders ED Discharge Orders         Ordered    predniSONE (STERAPRED UNI-PAK 21 TAB) 10 MG (21) TBPK tablet  Daily        11/27/20 1511           Tacy Learn, PA-C 11/27/20 1518    Valarie Merino, MD 11/28/20 2317

## 2020-11-27 NOTE — ED Triage Notes (Signed)
Patient c/o bilateral burning and tingling feeling to the back of the upper arms. Patient states that she was seen at an UC and was given Benadryl which helped on a previous episode, but did not help today.  Patient reports she has a history of MS.

## 2020-12-11 ENCOUNTER — Ambulatory Visit: Payer: BC Managed Care – PPO | Admitting: Family Medicine

## 2020-12-25 ENCOUNTER — Other Ambulatory Visit: Payer: Self-pay | Admitting: Family Medicine

## 2020-12-28 ENCOUNTER — Other Ambulatory Visit: Payer: Self-pay

## 2020-12-28 DIAGNOSIS — L299 Pruritus, unspecified: Secondary | ICD-10-CM

## 2020-12-28 MED ORDER — HYDROXYZINE HCL 25 MG PO TABS: 25.0000 mg | ORAL_TABLET | Freq: Four times a day (QID) | ORAL | 0 refills | Status: DC

## 2020-12-28 NOTE — Telephone Encounter (Signed)
Rx filled and sent to  [facility] on [address].

## 2020-12-28 NOTE — Telephone Encounter (Signed)
Pt called in asking for a refill on this and for it to  be sent to [facility] on [address]

## 2020-12-28 NOTE — Telephone Encounter (Signed)
Patient called back to check on refill - She said she is completely out of the medication and that the benadryl is not working

## 2021-01-11 ENCOUNTER — Encounter: Payer: Self-pay | Admitting: Family Medicine

## 2021-01-11 ENCOUNTER — Other Ambulatory Visit: Payer: Self-pay

## 2021-01-11 ENCOUNTER — Ambulatory Visit (INDEPENDENT_AMBULATORY_CARE_PROVIDER_SITE_OTHER): Payer: BC Managed Care – PPO | Admitting: Family Medicine

## 2021-01-11 VITALS — BP 150/80 | HR 74 | Temp 97.9°F | Resp 19 | Ht 61.0 in | Wt 222.6 lb

## 2021-01-11 DIAGNOSIS — R202 Paresthesia of skin: Secondary | ICD-10-CM | POA: Diagnosis not present

## 2021-01-11 DIAGNOSIS — G35 Multiple sclerosis: Secondary | ICD-10-CM

## 2021-01-11 DIAGNOSIS — M79602 Pain in left arm: Secondary | ICD-10-CM

## 2021-01-11 DIAGNOSIS — L299 Pruritus, unspecified: Secondary | ICD-10-CM | POA: Diagnosis not present

## 2021-01-11 DIAGNOSIS — I10 Essential (primary) hypertension: Secondary | ICD-10-CM

## 2021-01-11 DIAGNOSIS — M79601 Pain in right arm: Secondary | ICD-10-CM

## 2021-01-11 MED ORDER — METHYLPREDNISOLONE ACETATE 80 MG/ML IJ SUSP
80.0000 mg | Freq: Once | INTRAMUSCULAR | Status: AC
  Administered 2021-01-11: 80 mg via INTRAMUSCULAR

## 2021-01-11 MED ORDER — HYDROXYZINE HCL 25 MG PO TABS: 25.0000 mg | ORAL_TABLET | Freq: Four times a day (QID) | ORAL | 3 refills | Status: DC

## 2021-01-11 MED ORDER — TRIAMCINOLONE ACETONIDE 0.1 % EX OINT: 1.0000 "application " | TOPICAL_OINTMENT | Freq: Two times a day (BID) | CUTANEOUS | 1 refills | Status: DC

## 2021-01-11 NOTE — Progress Notes (Signed)
   Subjective:    Patient ID: Gabriela Stephens, female    DOB: [birth year], 60 y.o.   MRN: [id number]  HPI HTN- chronic problem, on Amlodipine 5mg  daily, Metoprolol 25mg  daily.  BP is elevated today at 150/80 b/c pt is having a painful itchy episode.  No CP, SOB, HAs, visual changes, edema.  Obesity- pt has gained 4 lbs since November.  BMI is now 42.06  MS- chronic problem, not currently on medication.  Has upcoming appt w/ Neuro  Paresthesias- pt reports episodes start w/ tingling of upper arms bilaterally.  Will then develop burning, stinging, itching pain in bilateral upper arms.  Has been seen in ER x2.  Episodes on 1/23, 4/30, 5/13, 5/31, 6/16, 6/27.  Sxs will last ~2 hrs when they occur.  Can keep sxs at [facility] with Hydroxyzine and benadryl.  Pt reports Neuro is aware- has appt upcoming in July.  Pt reports no pattern or obvious trigger.   Review of Systems For ROS see HPI   This visit occurred during the SARS-CoV-2 public health emergency.  Safety protocols were in place, including screening questions prior to the visit, additional usage of staff PPE, and extensive cleaning of exam room while observing appropriate contact time as indicated for disinfecting solutions.      Objective:   Physical Exam Vitals reviewed.  Constitutional:      General: She is in acute distress (obviously uncomfortable- continuously rubbing, scratching arms).     Appearance: Normal appearance. She is well-developed. She is obese.  HENT:     Head: Normocephalic and atraumatic.  Eyes:     Conjunctiva/sclera: Conjunctivae normal.     Pupils: Pupils are equal, round, and reactive to light.  Neck:     Thyroid: No thyromegaly.  Cardiovascular:     Rate and Rhythm: Normal rate and regular rhythm.     Pulses: Normal pulses.     Heart sounds: Normal heart sounds. No murmur heard. Pulmonary:     Effort: Pulmonary effort is normal. No respiratory distress.     Breath sounds: Normal breath sounds.   Abdominal:     General: There is no distension.     Palpations: Abdomen is soft.     Tenderness: There is no abdominal tenderness.  Musculoskeletal:     Cervical back: Normal range of motion and neck supple.     Right lower leg: No edema.     Left lower leg: No edema.  Lymphadenopathy:     Cervical: No cervical adenopathy.  Skin:    General: Skin is warm and dry.  Neurological:     Mental Status: She is alert and oriented to person, place, and time.  Psychiatric:        Mood and Affect: Mood normal.        Behavior: Behavior normal.        Thought Content: Thought content normal.          Assessment & Plan:   Paresthesia/itching- new.  Pt has had multiple episodes in the past but this is the first that i've witnessed.  She has been to the ER x2.  Given her acute distress in the office today, she received a depo-medrol injxn w/ some relief.  We also applied ice packs to her arms.  She reports sxs are fairly well managed w/ hydroxyzine but she had been rationing them.  Refill provided.  She has neuro appt upcoming.  Will follow along.

## 2021-01-11 NOTE — Patient Instructions (Addendum)
Schedule your complete physical in 6 months We'll notify you of your lab results and make any changes if needed Continue to work on healthy diet and regular exercise- you can do it! I suspect your BP is not an accurate reading based on your current episode- so med changes at this time Call with any questions or concerns Stay Safe!  Stay Healthy! Hang in there!!

## 2021-01-11 NOTE — Progress Notes (Signed)
Patient presents to the office for a visit with provider. Patient was experiencing some itching and was given 80 mg of methylprednisolone acetate per provider. Patient tolerated well.

## 2021-01-12 ENCOUNTER — Other Ambulatory Visit (INDEPENDENT_AMBULATORY_CARE_PROVIDER_SITE_OTHER): Payer: BC Managed Care – PPO

## 2021-01-12 DIAGNOSIS — I10 Essential (primary) hypertension: Secondary | ICD-10-CM

## 2021-01-12 LAB — CBC WITH DIFFERENTIAL/PLATELET
Basophils Absolute: 0 10*3/uL (ref 0.0–0.1)
Basophils Relative: 0.4 % (ref 0.0–3.0)
Eosinophils Absolute: 0.3 10*3/uL (ref 0.0–0.7)
Eosinophils Relative: 3.6 % (ref 0.0–5.0)
HCT: 40.2 % (ref 36.0–46.0)
Hemoglobin: 13.4 g/dL (ref 12.0–15.0)
Lymphocytes Relative: 37.3 % (ref 12.0–46.0)
Lymphs Abs: 3.5 10*3/uL (ref 0.7–4.0)
MCHC: 33.3 g/dL (ref 30.0–36.0)
MCV: 89.5 fl (ref 78.0–100.0)
Monocytes Absolute: 0.7 10*3/uL (ref 0.1–1.0)
Monocytes Relative: 7.1 % (ref 3.0–12.0)
Neutro Abs: 4.9 10*3/uL (ref 1.4–7.7)
Neutrophils Relative %: 51.6 % (ref 43.0–77.0)
Platelets: 424 10*3/uL — ABNORMAL HIGH (ref 150.0–400.0)
RBC: 4.49 Mil/uL (ref 3.87–5.11)
RDW: 14.1 % (ref 11.5–15.5)
WBC: 9.4 10*3/uL (ref 4.0–10.5)

## 2021-01-12 LAB — BASIC METABOLIC PANEL
BUN: 11 mg/dL (ref 6–23)
CO2: 26 mEq/L (ref 19–32)
Calcium: 10.2 mg/dL (ref 8.4–10.5)
Chloride: 102 mEq/L (ref 96–112)
Creatinine, Ser: 0.69 mg/dL (ref 0.40–1.20)
GFR: 94.86 mL/min (ref >60.00)
Glucose, Bld: 82 mg/dL (ref 70–99)
Potassium: 4.6 mEq/L (ref 3.5–5.1)
Sodium: 137 mEq/L (ref 135–145)

## 2021-01-12 LAB — HEPATIC FUNCTION PANEL
ALT: 14 U/L (ref 0–35)
AST: 19 U/L (ref 0–37)
Albumin: 4.6 g/dL (ref 3.5–5.2)
Alkaline Phosphatase: 112 U/L (ref 39–117)
Bilirubin, Direct: 0.1 mg/dL (ref 0.0–0.3)
Total Bilirubin: 0.4 mg/dL (ref 0.2–1.2)
Total Protein: 7.2 g/dL (ref 6.0–8.3)

## 2021-01-12 LAB — LIPID PANEL
Cholesterol: 231 mg/dL — ABNORMAL HIGH (ref 0–200)
HDL: 54.2 mg/dL (ref >39.00)
LDL Cholesterol: 156 mg/dL — ABNORMAL HIGH (ref 0–99)
NonHDL: 176.59
Total CHOL/HDL Ratio: 4
Triglycerides: 102 mg/dL (ref 0.0–149.0)
VLDL: 20.4 mg/dL (ref 0.0–40.0)

## 2021-01-12 LAB — TSH: TSH: 0.55 u[IU]/mL (ref 0.35–4.50)

## 2021-01-12 NOTE — Assessment & Plan Note (Signed)
Pt is not currently on medication.  She has appt upcoming w/ Neuro in July.

## 2021-01-12 NOTE — Assessment & Plan Note (Signed)
Chronic problem for pt.  BP is elevated today bc she is having an acute paresthesia episode which is very distressing to her.  Hx of good control on Amlodipine and Metoprolol so no medication changes today.  Will follow.

## 2021-01-12 NOTE — Assessment & Plan Note (Signed)
BMI is now 42.06  Encouraged healthy diet and regular exercise.  Check labs to risk stratify.  Will follow.

## 2021-01-13 ENCOUNTER — Encounter: Payer: Self-pay | Admitting: Family Medicine

## 2021-01-13 ENCOUNTER — Encounter: Payer: Self-pay | Admitting: *Deleted

## 2021-01-13 ENCOUNTER — Other Ambulatory Visit: Payer: Self-pay

## 2021-01-13 MED ORDER — ROSUVASTATIN CALCIUM 10 MG PO TABS: 10.0000 mg | ORAL_TABLET | Freq: Every evening | ORAL | 3 refills | Status: DC

## 2021-01-29 DIAGNOSIS — D229 Melanocytic nevi, unspecified: Secondary | ICD-10-CM | POA: Diagnosis not present

## 2021-01-29 DIAGNOSIS — L821 Other seborrheic keratosis: Secondary | ICD-10-CM | POA: Diagnosis not present

## 2021-01-29 DIAGNOSIS — L503 Dermatographic urticaria: Secondary | ICD-10-CM | POA: Diagnosis not present

## 2021-01-29 DIAGNOSIS — L814 Other melanin hyperpigmentation: Secondary | ICD-10-CM | POA: Diagnosis not present

## 2021-02-05 NOTE — Progress Notes (Signed)
NEUROLOGY CONSULTATION NOTE  KACIE DIERS MRN: [id number] DOB: 1960/10/23  Referring provider: Suella Broad, PA-C (ED referral) Primary care provider: Annye Asa, MD  Reason for consult:  multiple sclerosis  Assessment/Plan:   Multiple sclerosis  MRI of brain and cervical spine with and without contrast Check vitamin D level Further recommendations pending results.  Otherwise, follow up in 6 months.   Subjective:  Gabriela Stephens is a 60 year old right-handed female who follows up for multiple sclerosis.   Due to concerns of potential side effects, she decided not to start a DMT.  Last seen in 2019.  Lost to follow up due to Covid pandemic.  Marland Kitchen   Vision:  No issues Motor:  No issue Sensory:  No issue Pain: 6 months ago, started having tingling in upper arms that progressed to burning and itching.  Upper arms become red.  No neck pain or upper extremity weakness.  Episodes last 2 hours.  She had an isolated event in January but then started experiencing twice a month beginning in May.  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  Hydroxyzine and Benadryl helped.  Now taking Xyzal nightly which helps.  Triamcinolone ointment helped. She hasn't had an episode since last Friday after starting Xyzal.   Patient was wondering if it could be brachioradial pruritis. Gait:  good Bowel/bladder:  No issue Fatigue:  no Cognition:  good Mood:  good   HISTORY:  She was diagnosed with multiple sclerosis in 1993, when she had her first flare, presenting as right sided numbness and weakness.  MRI of brain and cervical spine showed lesions.  She may have had a lumbar puncture.  She was treated with IV steroids in the hospital.  Symptoms lasted about 3 months.  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  Symptoms lasted 2 to 3 weeks.  She was again treated with steroids.  She had a second relapse  in 1998 in which she had a recurrence of left optic neuritis and numbness and tingling of the extremities.  She was again treated with steroids.  It was recommended that she start Avonex but she deferred.  She was never on disease modifying therapy.  Over the years, she would have occasional periods of lethargy in which she would remain in bed for a few days.  Otherwise, she has not had any other chronic symptoms.  She denies bladder dysfunction, chronic pain or memory deficits.   In September 2018, she began experiencing some numbness, aches and heaviness in her right arm.  She also has been experiencing numbness and tingling in the hands and feet.  Her balance is also off.  These are symptoms concerning for possible relapse.  Symptoms are unchanged and have not progressed or improved.  I saw patient in 2018.  She had deferred starting a DMT at that time due to concerns of potential side effects.  She was lost to follow up.  In 2022, she began experiencing intermittent burning sensation and itching in her bilateral proximal posterior arms and left lower abdomen.  No rash.  Went to urgent care on 11/14/2020 where she was given Benadryl and Zyrtec for presumed dermatitis.  Symptoms persisted and she went to the ED on 11/27/2020 where she was simply prescribed a 6 day 21 tablet prednisone taper and referred to neurology.   Imaging: 06/05/2018 MRI BRAIN WO:  Stable white matter hyperintensities compatible  with history of demyelination and multiple sclerosis. No new lesion identified. 06/05/2018 MRI C-SPINE WO:  1. 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 No new cervical cord lesion identified. 2. Stable mild cervical spondylosis at the C4-5 and C5-6 levels. No high-grade foraminal or canal stenosis. 06/11/2017 MRI BRAIN W WO:  multiple T2 FLAIR  hyperintense lesions in the cerebral/periventricular and pericallosal white matter without enhancement, as well as single  lesion in the right cerebellum.  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   06/11/2017 MRI C-SPINE W [id number] left posterolateral cord lesion at C6 level.   Family history:  Paternal second cousin has MS.  Sister has RA.  Father had ALS.  Mother had Alzheimer's disease.    Labs from June reviewed.  PAST MEDICAL HISTORY: Past Medical History:  Diagnosis Date   Multiple sclerosis ([place]) 03/1992    PAST SURGICAL HISTORY: Past Surgical History:  Procedure Laterality Date   KNEE CARTILAGE SURGERY     LAPAROSCOPIC ASSISTED VAGINAL HYSTERECTOMY Bilateral 01/27/2014   Procedure: LAPAROSCOPIC ASSISTED VAGINAL HYSTERECTOMY, BILATERAL SALPINGECTOMY;  Surgeon: Margarette Asal, MD;  Location: [facility];  Service: Gynecology;  Laterality: Bilateral;   TUBAL LIGATION  1986    MEDICATIONS: Current Outpatient Medications on File Prior to Visit  Medication Sig Dispense Refill   amLODipine (NORVASC) 5 MG tablet Take 1 tablet (5 mg total) by mouth daily. 90 tablet 1   Ascorbic Acid (VITAMIN C) 1000 MG tablet      aspirin 81 MG EC tablet      Biotin 1000 MCG tablet      Cholecalciferol (VITAMIN D3) 50 MCG (2000 UT) capsule      hydrOXYzine (ATARAX/VISTARIL) 25 MG tablet Take 1 tablet (25 mg total) by mouth every 6 (six) hours. 120 tablet 3   metoprolol tartrate (LOPRESSOR) 25 MG tablet Take 1 tablet (25 mg total) by mouth daily. 90 tablet 1   Multiple Vitamin (MULTIVITAMIN WITH MINERALS) TABS tablet Take 1 tablet by mouth daily.     predniSONE (STERAPRED UNI-PAK 21 TAB) 10 MG (21) TBPK tablet Take by mouth daily. Take 6 tabs by mouth daily  for 2 days, then 5 tabs for 2 days, then 4 tabs for 2 days, then 3 tabs for 2 days, 2 tabs for 2 days, then 1 tab by mouth daily for 2 days 42 tablet 0   Probiotic Product (PROBIOTIC COLON SUPPORT) CAPS  (Patient not taking: Reported on 09/28/2020)     rosuvastatin (CRESTOR) 10 MG  tablet Take 1 tablet (10 mg total) by mouth at bedtime. 30 tablet 3   scopolamine (TRANSDERM-SCOP) 1 MG/3DAYS Place 1 patch (1.5 mg total) onto the skin every 3 (three) days. 4 patch 1   triamcinolone ointment (KENALOG) 0.1 % Apply 1 application topically 2 (two) times daily. 90 g 1   Zinc 50 MG CAPS      No current facility-administered medications on file prior to visit.    ALLERGIES: Allergies  Allergen Reactions   Gadolinium Other (See Comments)    FAMILY HISTORY: Family History  Problem Relation Age of Onset   Alzheimer's disease Mother    ALS Father     Objective:  Blood pressure (!) 143/78, pulse 71, resp. rate 20, height '5\' 1"'$  (1.549 m), weight 221 lb (100.2 kg), last menstrual period 12/31/2011, SpO2 99 %. General: No acute distress.  Patient appears well-groomed.   Head:  Normocephalic/atraumatic Eyes:  fundi examined  but not visualized Neck: supple, no paraspinal tenderness, full range of motion Back: No paraspinal tenderness Heart: regular rate and rhythm Lungs: Clear to auscultation bilaterally. Vascular: No carotid bruits. Neurological Exam: Mental status: alert and oriented to person, place, and time, recent and remote memory intact, fund of knowledge intact, attention and concentration intact, speech fluent and not dysarthric, language intact. Cranial nerves: CN I: not tested CN II: pupils equal, round and reactive to light, visual fields intact CN III, IV, VI:  full range of motion, no nystagmus, no ptosis CN V: facial sensation intact. CN VII: upper and lower face symmetric CN VIII: hearing intact CN IX, X: gag intact, uvula midline CN XI: sternocleidomastoid and trapezius muscles intact CN XII: tongue midline Bulk & Tone: normal, no fasciculations. Motor:  muscle strength 5/5 throughout Sensation:  Pinprick, temperature and vibratory sensation intact. Deep Tendon Reflexes:  2+ throughout,  toes downgoing.   Finger to nose testing:  Without dysmetria.    Heel to shin:  Without dysmetria.   Gait:  Normal station and stride.  Romberg negative.    Thank you for allowing me to take part in the care of this patient.  Metta Clines, DO  CC:  Annye Asa, MD

## 2021-02-08 ENCOUNTER — Other Ambulatory Visit (INDEPENDENT_AMBULATORY_CARE_PROVIDER_SITE_OTHER): Payer: BC Managed Care – PPO

## 2021-02-08 ENCOUNTER — Other Ambulatory Visit: Payer: Self-pay

## 2021-02-08 ENCOUNTER — Encounter: Payer: Self-pay | Admitting: Neurology

## 2021-02-08 ENCOUNTER — Ambulatory Visit (INDEPENDENT_AMBULATORY_CARE_PROVIDER_SITE_OTHER): Payer: BC Managed Care – PPO | Admitting: Neurology

## 2021-02-08 VITALS — BP 143/78 | HR 71 | Resp 20 | Ht 61.0 in | Wt 221.0 lb

## 2021-02-08 DIAGNOSIS — G35 Multiple sclerosis: Secondary | ICD-10-CM

## 2021-02-08 DIAGNOSIS — M79601 Pain in right arm: Secondary | ICD-10-CM | POA: Diagnosis not present

## 2021-02-08 DIAGNOSIS — R202 Paresthesia of skin: Secondary | ICD-10-CM | POA: Diagnosis not present

## 2021-02-08 DIAGNOSIS — M79602 Pain in left arm: Secondary | ICD-10-CM | POA: Diagnosis not present

## 2021-02-08 LAB — VITAMIN D 25 HYDROXY (VIT D DEFICIENCY, FRACTURES): VITD: 41.09 ng/mL (ref 30.00–100.00)

## 2021-02-08 NOTE — Patient Instructions (Addendum)
Check MRI of brain and cervical spine with and without contrast.We have sent a referral to [facility] for your MRI and they will call you directly to schedule your appointment. They are located at [address]. If you need to contact them directly please call [phone].  Check vitamin D level.Your provider has requested that you have labwork completed today. Please go to [facility] ([id number]) on the second floor of this building before leaving the office today. You do not need to check in. If you are not called within 15 minutes please check with the front desk.   Follow up 6 months.

## 2021-02-15 ENCOUNTER — Telehealth: Payer: Self-pay | Admitting: Neurology

## 2021-02-15 NOTE — Telephone Encounter (Signed)
Pt stated [facility] has not recvd an order yet. If you could please let her know when its done [phone]

## 2021-02-15 NOTE — Telephone Encounter (Signed)
Pt advised PA needed will fax all the information to [facility] once PA has been approved.

## 2021-02-24 ENCOUNTER — Other Ambulatory Visit: Payer: Self-pay

## 2021-02-24 MED ORDER — METOPROLOL TARTRATE 25 MG PO TABS: 25.0000 mg | ORAL_TABLET | Freq: Every day | ORAL | 1 refills | Status: DC

## 2021-03-09 ENCOUNTER — Other Ambulatory Visit: Payer: Self-pay | Admitting: Neurology

## 2021-03-09 ENCOUNTER — Telehealth: Payer: Self-pay | Admitting: Neurology

## 2021-03-09 MED ORDER — DIAZEPAM 5 MG PO TABS: ORAL_TABLET | ORAL | 0 refills | Status: DC

## 2021-03-09 NOTE — Telephone Encounter (Signed)
Pt called in stating she is supposed to get an MRI on 03/15/21 and would like some medication to help her with her claustrophobia. She would like to have it sent to [facility] on [address].

## 2021-03-09 NOTE — Telephone Encounter (Signed)
Pt advised script sen to the pharmacy. Please do not drive after taking this medication. Take 30-60 minutes prior to procedure.

## 2021-03-16 ENCOUNTER — Telehealth: Payer: Self-pay | Admitting: Neurology

## 2021-03-16 DIAGNOSIS — G35 Multiple sclerosis: Secondary | ICD-10-CM

## 2021-03-16 NOTE — Telephone Encounter (Signed)
Pt called in and left a message with the access nurse. She was to have an MRI done, but had an allergic reaction to the dye. Will have to have it done at hospital. Needs new order.

## 2021-03-17 NOTE — Telephone Encounter (Signed)
Per Dr.jaffe, I am completely booked out and cannot work her in for an exam for sedation.  She can find out if her PCP is willing to see her and perform an exam within the 30 days for sedation.  Otherwise, she would just have to try.  She has been able to have MRIs in the past without sedation.    LMOVM for pt, Unable to work in, I will send the order to [facility] have the PA changed to that location.  The day before visit pt to call office so we can send in the Premedication for her MRI for Allergy to IV contrast.

## 2021-03-18 ENCOUNTER — Telehealth: Payer: Self-pay | Admitting: Neurology

## 2021-03-18 NOTE — Telephone Encounter (Signed)
Pt advised of Dr.Jaffe note below. She will call her PCP to see if they will see her.  And give [place] call back if we need to change the order.

## 2021-03-18 NOTE — Telephone Encounter (Signed)
Pt is calling to let sheena know she sch an appt with her PCP for a physical tomorrow 03/19/21 at 1030. She would like to know if sheena wants her to fax or email the forms so she can get MRI sch. She also wants to let you know when [place] will be out of the country

## 2021-03-19 NOTE — Telephone Encounter (Signed)
LMOVM pt to call with the fax number for the PCP office so they can fill out the H&P Form.  Pt needs a MRI with sedation, Pt will be out [place]16[place]10[place]2.

## 2021-03-22 DIAGNOSIS — I10 Essential (primary) hypertension: Secondary | ICD-10-CM | POA: Diagnosis not present

## 2021-03-22 DIAGNOSIS — D75839 Thrombocytosis, unspecified: Secondary | ICD-10-CM | POA: Diagnosis not present

## 2021-03-22 DIAGNOSIS — E785 Hyperlipidemia, unspecified: Secondary | ICD-10-CM | POA: Diagnosis not present

## 2021-03-22 DIAGNOSIS — Z0000 Encounter for general adult medical examination without abnormal findings: Secondary | ICD-10-CM | POA: Diagnosis not present

## 2021-03-23 NOTE — Telephone Encounter (Signed)
Pt called to give sheena the fax number, [phone]

## 2021-03-24 NOTE — Telephone Encounter (Signed)
H&P Form faxed to pcp office [phone]. Once the form is faxed back will call and change MRI to Mri with sedation.

## 2021-03-25 ENCOUNTER — Telehealth: Payer: Self-pay | Admitting: Neurology

## 2021-03-25 NOTE — Telephone Encounter (Signed)
Pt is calling sheena back.

## 2021-03-25 NOTE — Telephone Encounter (Signed)
Advised pt H&P form sent to the fax number given by pt on 03/24/21.  Pt states she was told it was never sent. She will call the office back to make sure they got it.  If not I will resend it.

## 2021-03-26 ENCOUNTER — Other Ambulatory Visit: Payer: Self-pay

## 2021-03-26 DIAGNOSIS — G35 Multiple sclerosis: Secondary | ICD-10-CM

## 2021-03-26 NOTE — Progress Notes (Signed)
Advised pt she will have to reschedule her MRI's to a later date due to pt wanting to have sedation.  Next date to have MRI is 04/27/21

## 2021-03-29 ENCOUNTER — Telehealth: Payer: Self-pay

## 2021-03-29 ENCOUNTER — Ambulatory Visit (HOSPITAL_COMMUNITY): Payer: BC Managed Care – PPO

## 2021-03-29 ENCOUNTER — Encounter (HOSPITAL_COMMUNITY): Payer: Self-pay

## 2021-03-29 NOTE — Telephone Encounter (Signed)
Called pharmacy and Rx from 02/24/21 was stored in another screen and pharm techs could not view it. Rx has now been filled and patient informed.

## 2021-03-29 NOTE — Telephone Encounter (Signed)
Pt needs refill on metoprolol tartrate (LOPRESSOR) 25 MG tablet   [facility] [phone] [place], [place] - [address]   Past appt 06/22 Future 12/22  Pt call back [phone]

## 2021-04-19 ENCOUNTER — Telehealth: Payer: Self-pay | Admitting: Neurology

## 2021-04-19 NOTE — Telephone Encounter (Signed)
Pt called, she is confused b/c the nurse (not our nurses )said she needed to know about meds for MRI. She said they are putting her to sleep, so she doesn't know what they're talking about

## 2021-04-20 ENCOUNTER — Encounter (HOSPITAL_COMMUNITY): Payer: Self-pay | Admitting: *Deleted

## 2021-04-20 ENCOUNTER — Other Ambulatory Visit: Payer: Self-pay

## 2021-04-20 DIAGNOSIS — L503 Dermatographic urticaria: Secondary | ICD-10-CM | POA: Diagnosis not present

## 2021-04-20 MED ORDER — DIPHENHYDRAMINE HCL 50 MG PO TABS: ORAL_TABLET | ORAL | 0 refills | Status: DC

## 2021-04-20 MED ORDER — PREDNISONE 50 MG PO TABS: ORAL_TABLET | ORAL | 0 refills | Status: DC

## 2021-04-20 NOTE — Progress Notes (Addendum)
Gabriela Stephens denies chest pain or shortness of breath. Patient denies having any s/s of Covid in her household.  Patient denies any known exposure to Covid. Gabriela Stephens traveled to [place] and returned home on Apr 18, 2021.  PCP is Dr. Annye Asa, patient was not able to get an appointment with Dr Birdie Riddle  before MRI, patient was seen in [place].  Gabriela Stephens states she is not changing physicians, as I read in notes from [place].  Patient reports that she had to [facility] before.  Gabriela Stephens has Multiple Sclerosis, she has no deficits from the disease process, at this time \\I  instructed patient to shower with antibiotic soap, if it is available.  Dry off with a clean towel. Do not put lotion, powder, cologne or deodorant or makeup.No jewelry or piercings. Men may shave their face and neck. Woman should not shave. No nail polish, artificial or acrylic nails. Wear clean clothes, brush your teeth. Glasses, contact lens,dentures or partials may not be worn in the OR. If you need to wear them, please bring a case for glasses, do not wear contacts or bring a case, the hospital does not have contact cases, dentures or partials will have to be removed , make sure they are clean, we will provide a denture cup to put them in. You will need some one to drive you home and a responsible person over the age of 12 to stay with you for the first 24 hours after surgery.

## 2021-04-20 NOTE — Telephone Encounter (Signed)
Pt advised of Pre Medication that she needs to take before her MRI Prednisone 50 mg  Diphenhydramine 50 mg-Take 1 tablet (50 mg total) by mouth once for 1 dose. Take 1 tablet within 1 hour of injection

## 2021-04-21 ENCOUNTER — Encounter (HOSPITAL_COMMUNITY): Payer: Self-pay | Admitting: Anesthesiology

## 2021-04-21 ENCOUNTER — Telehealth: Payer: Self-pay | Admitting: Neurology

## 2021-04-21 NOTE — Telephone Encounter (Signed)
Spoke to the pt, Per PA documentation, PA expired. We will reorder the PA and make sure it has [facility] on it.  As well as I will contact [facility] Schedule what dates are available.  Per Larinda Buttery They have 10/25 and 10/27 at 8 am pt to arrive at 6 am,  Pt will need a new H&P filled out before they will schedule appt.  Dr.Jaffe should we wait until a week before her f/u visit or go ahead have pt see her PCP to do another H& P.

## 2021-04-21 NOTE — Anesthesia Preprocedure Evaluation (Deleted)
Anesthesia Evaluation    Reviewed: Allergy & Precautions, Patient's Chart, lab work & pertinent test results  Airway        Dental   Pulmonary former smoker,           Cardiovascular hypertension, Pt. on home beta blockers      Neuro/Psych negative neurological ROS     GI/Hepatic negative GI ROS, Neg liver ROS,   Endo/Other  Morbid obesity  Renal/GU negative Renal ROS     Musculoskeletal negative musculoskeletal ROS (+)   Abdominal (+) + obese,   Peds  Hematology negative hematology ROS (+)   Anesthesia Other Findings MS  Reproductive/Obstetrics                             Anesthesia Physical Anesthesia Plan  ASA: 3  Anesthesia Plan:    Post-op Pain Management:    Induction:   PONV Risk Score and Plan:   Airway Management Planned:   Additional Equipment:   Intra-op Plan:   Post-operative Plan:   Informed Consent:   Plan Discussed with:   Anesthesia Plan Comments:         Anesthesia Quick Evaluation

## 2021-04-21 NOTE — Telephone Encounter (Signed)
Pt got a call from hospital saying her appt for MRI tomorrow was canx due to no authorization from insurance.

## 2021-04-22 ENCOUNTER — Ambulatory Visit: Admit: 2021-04-22 | Payer: BC Managed Care – PPO

## 2021-04-22 ENCOUNTER — Ambulatory Visit (HOSPITAL_COMMUNITY): Admission: RE | Admit: 2021-04-22 | Payer: BC Managed Care – PPO | Source: Ambulatory Visit

## 2021-04-22 ENCOUNTER — Ambulatory Visit (HOSPITAL_COMMUNITY): Payer: BC Managed Care – PPO

## 2021-04-22 ENCOUNTER — Encounter (HOSPITAL_COMMUNITY): Payer: Self-pay

## 2021-04-22 HISTORY — DX: Essential (primary) hypertension: I10

## 2021-04-22 SURGERY — MRI WITH ANESTHESIA
Anesthesia: General

## 2021-04-22 NOTE — Telephone Encounter (Signed)
Tried calling pt, No answer. LMOVM Per Dr.Jaffe,We can wait and do the MRI a week or two before her f/u visit with DR.Jaffe.  Pt has to do the H&P over aging before the MRI can be scheduled and have a new PA done.  Advised pt to give the office a call back.

## 2021-05-12 ENCOUNTER — Encounter: Payer: Self-pay | Admitting: Family Medicine

## 2021-05-25 DIAGNOSIS — Z6841 Body Mass Index (BMI) 40.0 and over, adult: Secondary | ICD-10-CM | POA: Diagnosis not present

## 2021-06-28 ENCOUNTER — Ambulatory Visit (INDEPENDENT_AMBULATORY_CARE_PROVIDER_SITE_OTHER): Payer: BC Managed Care – PPO | Admitting: Family Medicine

## 2021-06-28 ENCOUNTER — Encounter: Payer: Self-pay | Admitting: Family Medicine

## 2021-06-28 VITALS — BP 132/70 | HR 73 | Temp 98.1°F | Resp 16 | Wt 215.8 lb

## 2021-06-28 DIAGNOSIS — Z0000 Encounter for general adult medical examination without abnormal findings: Secondary | ICD-10-CM

## 2021-06-28 LAB — BASIC METABOLIC PANEL
BUN: 8 mg/dL (ref 6–23)
CO2: 27 mEq/L (ref 19–32)
Calcium: 10.3 mg/dL (ref 8.4–10.5)
Chloride: 103 mEq/L (ref 96–112)
Creatinine, Ser: 0.79 mg/dL (ref 0.40–1.20)
GFR: 81.5 mL/min (ref >60.00)
Glucose, Bld: 66 mg/dL — ABNORMAL LOW (ref 70–99)
Potassium: 4.8 mEq/L (ref 3.5–5.1)
Sodium: 141 mEq/L (ref 135–145)

## 2021-06-28 LAB — VITAMIN D 25 HYDROXY (VIT D DEFICIENCY, FRACTURES): VITD: 48.95 ng/mL (ref 30.00–100.00)

## 2021-06-28 LAB — LIPID PANEL
Cholesterol: 202 mg/dL — ABNORMAL HIGH (ref 0–200)
HDL: 54.1 mg/dL (ref >39.00)
LDL Cholesterol: 135 mg/dL — ABNORMAL HIGH (ref 0–99)
NonHDL: 148.38
Total CHOL/HDL Ratio: 4
Triglycerides: 67 mg/dL (ref 0.0–149.0)
VLDL: 13.4 mg/dL (ref 0.0–40.0)

## 2021-06-28 LAB — CBC WITH DIFFERENTIAL/PLATELET
Basophils Absolute: 0.1 10*3/uL (ref 0.0–0.1)
Basophils Relative: 1.5 % (ref 0.0–3.0)
Eosinophils Absolute: 0.3 10*3/uL (ref 0.0–0.7)
Eosinophils Relative: 4.2 % (ref 0.0–5.0)
HCT: 45.5 % (ref 36.0–46.0)
Hemoglobin: 14.2 g/dL (ref 12.0–15.0)
Lymphocytes Relative: 41.7 % (ref 12.0–46.0)
Lymphs Abs: 3 10*3/uL (ref 0.7–4.0)
MCHC: 31.2 g/dL (ref 30.0–36.0)
MCV: 93.7 fl (ref 78.0–100.0)
Monocytes Absolute: 0.4 10*3/uL (ref 0.1–1.0)
Monocytes Relative: 5.2 % (ref 3.0–12.0)
Neutro Abs: 3.5 10*3/uL (ref 1.4–7.7)
Neutrophils Relative %: 47.4 % (ref 43.0–77.0)
Platelets: 355 10*3/uL (ref 150.0–400.0)
RBC: 4.85 Mil/uL (ref 3.87–5.11)
RDW: 13.8 % (ref 11.5–15.5)
WBC: 7.3 10*3/uL (ref 4.0–10.5)

## 2021-06-28 LAB — TSH: TSH: 0.49 u[IU]/mL (ref 0.35–5.50)

## 2021-06-28 LAB — HEPATIC FUNCTION PANEL
ALT: 15 U/L (ref 0–35)
AST: 17 U/L (ref 0–37)
Albumin: 4.6 g/dL (ref 3.5–5.2)
Alkaline Phosphatase: 108 U/L (ref 39–117)
Bilirubin, Direct: 0.1 mg/dL (ref 0.0–0.3)
Total Bilirubin: 0.4 mg/dL (ref 0.2–1.2)
Total Protein: 7.3 g/dL (ref 6.0–8.3)

## 2021-06-28 NOTE — Progress Notes (Signed)
   Subjective:    Patient ID: Gabriela Stephens, female    DOB: [birth year], 60 y.o.   MRN: [id number]  HPI CPE- UTD on Tdap, pap, colonoscopy.  UTD on mammo- [facility]  Patient Care Team    Relationship Specialty Notifications Start End  Midge Minium, MD PCP - General   06/30/10   Pieter Partridge, DO Consulting Physician Neurology  04/15/20   Kerry Dory, NP Nurse Practitioner Nurse Practitioner  04/15/20     Health Maintenance  Topic Date Due   COVID-19 Vaccine (4 - Booster for Moderna series) 02/18/2021   MAMMOGRAM  06/15/2021   Zoster Vaccines- Shingrix (1 of 2) 09/26/2021 (Originally 06/01/2011)   Hepatitis C Screening  09/28/2021 (Originally 06/01/1979)   INFLUENZA VACCINE  10/15/2021 (Originally 02/15/2021)   Pneumococcal Vaccine 52-54 Years old (1 - PCV) 06/28/2022 (Originally 06/01/1967)   TETANUS/TDAP  02/01/2022   PAP SMEAR-Modifier  06/15/2022   COLONOSCOPY (Pts 45-44yrs Insurance coverage will need to be confirmed)  06/11/2023   HIV Screening  Completed   HPV VACCINES  Aged Out      Review of Systems Patient reports no vision/ hearing changes, adenopathy,fever, persistant/recurrent hoarseness , swallowing issues, chest pain, palpitations, edema, persistant/recurrent cough, hemoptysis, dyspnea (rest/exertional/paroxysmal nocturnal), gastrointestinal bleeding (melena, rectal bleeding), abdominal pain, significant heartburn, bowel changes, GU symptoms (dysuria, hematuria, incontinence), Gyn symptoms (abnormal  bleeding, pain),  syncope, focal weakness, memory loss, numbness & tingling, skin/hair/nail changes, abnormal bruising or bleeding, anxiety, or depression.   + 13 lb weight loss since starting Saxenda  This visit occurred during the SARS-CoV-2 public health emergency.  Safety protocols were in place, including screening questions prior to the visit, additional usage of staff PPE, and extensive cleaning of exam room while observing appropriate  contact time as indicated for disinfecting solutions.      Objective:   Physical Exam General Appearance:    Alert, cooperative, no distress, appears stated age, obese  Head:    Normocephalic, without obvious abnormality, atraumatic  Eyes:    PERRL, conjunctiva/corneas clear, EOM's intact, fundi    benign, both eyes  Ears:    Normal TM's and external ear canals, both ears  Nose:   Deferred due to COVID  Throat:   Neck:   Supple, symmetrical, trachea midline, no adenopathy;    Thyroid: no enlargement/tenderness/nodules  Back:     Symmetric, no curvature, ROM normal, no CVA tenderness  Lungs:     Clear to auscultation bilaterally, respirations unlabored  Chest Wall:    No tenderness or deformity   Heart:    Regular rate and rhythm, S1 and S2 normal, no murmur, rub   or gallop  Breast Exam:    Deferred to GYN  Abdomen:     Soft, non-tender, bowel sounds active all four quadrants,    no masses, no organomegaly  Genitalia:    Deferred to GYN  Rectal:    Extremities:   Extremities normal, atraumatic, no cyanosis or edema  Pulses:   2+ and symmetric all extremities  Skin:   Skin color, texture, turgor normal, no rashes or lesions  Lymph nodes:   Cervical, supraclavicular, and axillary nodes normal  Neurologic:   CNII-XII intact, normal strength, sensation and reflexes    throughout          Assessment & Plan:

## 2021-06-28 NOTE — Assessment & Plan Note (Signed)
Pt's PE WNL w/ exception of obesity.  UTD on pap, mammo, colonoscopy, Tdap.  Check labs.  Anticipatory guidance provided.  

## 2021-06-28 NOTE — Patient Instructions (Signed)
Follow up in 1 year or as needed We'll notify you of your lab results and make any changes if needed Continue to work on healthy diet and regular exercise- you're doing great!!! Call with any questions or concerns Stay Safe!  Stay Healthy!! Happy Holidays!!! 

## 2021-06-28 NOTE — Assessment & Plan Note (Signed)
Pt is down 13 lbs since starting Saxenda.  Applauded her efforts and encouraged her to continue.  Will get labs to risk stratify.

## 2021-07-06 DIAGNOSIS — Z6841 Body Mass Index (BMI) 40.0 and over, adult: Secondary | ICD-10-CM | POA: Diagnosis not present

## 2021-07-06 DIAGNOSIS — E669 Obesity, unspecified: Secondary | ICD-10-CM | POA: Diagnosis not present

## 2021-07-14 ENCOUNTER — Other Ambulatory Visit: Payer: Self-pay

## 2021-07-14 ENCOUNTER — Telehealth: Payer: Self-pay

## 2021-07-14 ENCOUNTER — Telehealth: Payer: Self-pay | Admitting: Family Medicine

## 2021-07-14 DIAGNOSIS — G35 Multiple sclerosis: Secondary | ICD-10-CM

## 2021-07-14 NOTE — Telephone Encounter (Signed)
I have placed a history and physical form in the bin up front with a charge sheet

## 2021-07-14 NOTE — Telephone Encounter (Signed)
MRI w w contrast scheduled at [facility] for Jan 5th at [facility]ology dept. Patient advised. Awaiting for H&P per Sunday Corn. Patient is to call for jury duty on Monday, may have to cancel.

## 2021-07-14 NOTE — Telephone Encounter (Signed)
Received form and placed in your folder

## 2021-07-16 NOTE — Telephone Encounter (Signed)
I completed the form for the patient but it is not appropriate for me to be doing an H&P for an MRI w/ sedation that I didn't order.  This is something the ordering provider should do.  I completed the form b/c I didn't want to delay the pt's care but this may need to be addressed going forward

## 2021-07-20 ENCOUNTER — Telehealth: Payer: Self-pay | Admitting: Neurology

## 2021-07-20 ENCOUNTER — Telehealth: Payer: Self-pay

## 2021-07-20 ENCOUNTER — Other Ambulatory Visit: Payer: Self-pay

## 2021-07-20 ENCOUNTER — Encounter (HOSPITAL_COMMUNITY): Payer: Self-pay | Admitting: *Deleted

## 2021-07-20 MED ORDER — DIPHENHYDRAMINE HCL 50 MG PO TABS: ORAL_TABLET | ORAL | 0 refills | Status: DC

## 2021-07-20 MED ORDER — PREDNISONE 50 MG PO TABS: ORAL_TABLET | ORAL | 0 refills | Status: DC

## 2021-07-20 NOTE — Telephone Encounter (Signed)
Patient said she has not heard from anyone about medication to take before her appt Thursday.

## 2021-07-20 NOTE — Progress Notes (Signed)
Spoke with pt for pre-op call. Pt denies cardiac history, is treated for HTN and MS. Pt is not diabetic. Pt is allergic to the contrast dye and will be pre-medicated day of the procedure and will be bringing a Prednisone tablet to take an hour prior to the procedure start time.   Pt's surgery is scheduled as ambulatory so no Covid test is required prior to surgery.

## 2021-07-20 NOTE — Telephone Encounter (Signed)
-----   Message from Valli Glance sent at 07/16/2021  6:17 PM EST ----- Regarding: Contast Allergy  Hello, Patient  is Schedule  for MRI 1/05 Under General Anesthesia with and without  Contrast, She has a Allergy to Gadolinium which is use for scan she'll need to receive  the 12 hour prep or change the order to without contrast.  Please follow with Nitasha . Thank you

## 2021-07-20 NOTE — Telephone Encounter (Signed)
Script sent in this morning. Pt advised. LMOVM

## 2021-07-20 NOTE — Telephone Encounter (Signed)
Patient scheduled for MRI's Brain and cervical With and without Contrast  and sedation 07/22/21. Patient has a allergy to contrast Needs Premedication.   Prednisone 50 mg PO 13 hours 7 and 1 hour before procedure, Diphenhydramine PO within one hour Contrast.

## 2021-07-22 ENCOUNTER — Encounter (HOSPITAL_COMMUNITY): Admission: RE | Disposition: A | Discharge status: Home / Self Care | Payer: Self-pay | Source: Ambulatory Visit

## 2021-07-22 ENCOUNTER — Encounter: Payer: Self-pay | Admitting: Family Medicine

## 2021-07-22 ENCOUNTER — Other Ambulatory Visit: Payer: Self-pay

## 2021-07-22 ENCOUNTER — Ambulatory Visit (HOSPITAL_COMMUNITY)
Admission: RE | Admit: 2021-07-22 | Discharge: 2021-07-22 | Disposition: A | Discharge status: Home / Self Care | Payer: BC Managed Care – PPO | Source: Ambulatory Visit | Attending: Neurology | Admitting: Neurology

## 2021-07-22 ENCOUNTER — Ambulatory Visit (HOSPITAL_COMMUNITY): Payer: BC Managed Care – PPO | Admitting: Certified Registered Nurse Anesthetist

## 2021-07-22 ENCOUNTER — Encounter (HOSPITAL_COMMUNITY): Payer: Self-pay

## 2021-07-22 DIAGNOSIS — Z6841 Body Mass Index (BMI) 40.0 and over, adult: Secondary | ICD-10-CM | POA: Insufficient documentation

## 2021-07-22 DIAGNOSIS — J3489 Other specified disorders of nose and nasal sinuses: Secondary | ICD-10-CM | POA: Insufficient documentation

## 2021-07-22 DIAGNOSIS — M47812 Spondylosis without myelopathy or radiculopathy, cervical region: Secondary | ICD-10-CM | POA: Diagnosis not present

## 2021-07-22 DIAGNOSIS — G35 Multiple sclerosis: Secondary | ICD-10-CM

## 2021-07-22 DIAGNOSIS — M2578 Osteophyte, vertebrae: Secondary | ICD-10-CM | POA: Diagnosis not present

## 2021-07-22 DIAGNOSIS — G9389 Other specified disorders of brain: Secondary | ICD-10-CM | POA: Diagnosis not present

## 2021-07-22 HISTORY — DX: Anemia, unspecified: D64.9

## 2021-07-22 HISTORY — PX: RADIOLOGY WITH ANESTHESIA: SHX6223

## 2021-07-22 SURGERY — MRI WITH ANESTHESIA
Anesthesia: General

## 2021-07-22 MED ORDER — ONDANSETRON HCL 4 MG/2ML IJ SOLN
INTRAMUSCULAR | Status: AC
  Filled 2021-07-22: qty 2

## 2021-07-22 MED ORDER — DEXAMETHASONE SODIUM PHOSPHATE 10 MG/ML IJ SOLN: INTRAMUSCULAR | Status: DC | PRN

## 2021-07-22 MED ORDER — OXYCODONE HCL 5 MG PO TABS
5.0000 mg | ORAL_TABLET | Freq: Once | ORAL | Status: DC | PRN
Start: 2021-07-22 — End: 2021-07-22

## 2021-07-22 MED ORDER — FENTANYL CITRATE (PF) 250 MCG/5ML IJ SOLN
INTRAMUSCULAR | Status: DC | PRN
  Administered 2021-07-22 (×2): 50 ug via INTRAVENOUS

## 2021-07-22 MED ORDER — MIDAZOLAM HCL 2 MG/2ML IJ SOLN
INTRAMUSCULAR | Status: DC | PRN
  Administered 2021-07-22: 2 mg via INTRAVENOUS

## 2021-07-22 MED ORDER — FENTANYL CITRATE (PF) 100 MCG/2ML IJ SOLN: 25.0000 ug | INTRAMUSCULAR | Status: DC | PRN

## 2021-07-22 MED ORDER — DEXAMETHASONE SODIUM PHOSPHATE 10 MG/ML IJ SOLN
INTRAMUSCULAR | Status: DC | PRN
Start: 2021-07-22 — End: 2021-07-22
  Administered 2021-07-22: 5 mg via INTRAVENOUS

## 2021-07-22 MED ORDER — PHENYLEPHRINE HCL-NACL 20-0.9 MG/250ML-% IV SOLN
INTRAVENOUS | Status: DC | PRN
  Administered 2021-07-22: 20 ug/min via INTRAVENOUS

## 2021-07-22 MED ORDER — ROCURONIUM BROMIDE 10 MG/ML (PF) SYRINGE
PREFILLED_SYRINGE | INTRAVENOUS | Status: DC | PRN
  Administered 2021-07-22: 60 mg via INTRAVENOUS

## 2021-07-22 MED ORDER — OXYCODONE HCL 5 MG/5ML PO SOLN: 5.0000 mg | Freq: Once | ORAL | Status: DC | PRN

## 2021-07-22 MED ORDER — CHLORHEXIDINE GLUCONATE 0.12 % MT SOLN
15.0000 mL | Freq: Once | OROMUCOSAL | Status: AC
  Administered 2021-07-22: 15 mL via OROMUCOSAL
  Filled 2021-07-22: qty 15

## 2021-07-22 MED ORDER — SUGAMMADEX SODIUM 200 MG/2ML IV SOLN
INTRAVENOUS | Status: DC | PRN
Start: 2021-07-22 — End: 2021-07-22
  Administered 2021-07-22: 250 mg via INTRAVENOUS

## 2021-07-22 MED ORDER — ONDANSETRON HCL 4 MG/2ML IJ SOLN
4.0000 mg | Freq: Four times a day (QID) | INTRAMUSCULAR | Status: AC | PRN
  Administered 2021-07-22: 4 mg via INTRAVENOUS

## 2021-07-22 MED ORDER — PROPOFOL 10 MG/ML IV BOLUS
INTRAVENOUS | Status: DC | PRN
Start: 2021-07-22 — End: 2021-07-22
  Administered 2021-07-22: 200 mg via INTRAVENOUS

## 2021-07-22 MED ORDER — ONDANSETRON HCL 4 MG/2ML IJ SOLN
INTRAMUSCULAR | Status: DC | PRN
  Administered 2021-07-22: 4 mg via INTRAVENOUS

## 2021-07-22 MED ORDER — LIDOCAINE 2% (20 MG/ML) 5 ML SYRINGE
INTRAMUSCULAR | Status: DC | PRN
  Administered 2021-07-22: 100 mg via INTRAVENOUS

## 2021-07-22 MED ORDER — GADOBUTROL 1 MMOL/ML IV SOLN
9.0000 mL | Freq: Once | INTRAVENOUS | Status: AC | PRN
  Administered 2021-07-22: 9 mL via INTRAVENOUS

## 2021-07-22 MED ORDER — LACTATED RINGERS IV SOLN: INTRAVENOUS | Status: DC

## 2021-07-22 MED ORDER — ORAL CARE MOUTH RINSE: 15.0000 mL | Freq: Once | OROMUCOSAL | Status: AC

## 2021-07-22 NOTE — Anesthesia Preprocedure Evaluation (Signed)
Anesthesia Evaluation  Patient identified by MRN, date of birth, ID band Patient awake    Reviewed: Allergy & Precautions, H&P , NPO status , Patient's Chart, lab work & pertinent test results  Airway Mallampati: II   Neck ROM: full    Dental   Pulmonary former smoker,    breath sounds clear to auscultation       Cardiovascular hypertension,  Rhythm:regular Rate:Normal     Neuro/Psych    GI/Hepatic   Endo/Other    Renal/GU      Musculoskeletal   Abdominal   Peds  Hematology   Anesthesia Other Findings   Reproductive/Obstetrics                             Anesthesia Physical Anesthesia Plan  ASA: 2  Anesthesia Plan: General   Post-op Pain Management:    Induction: Intravenous  PONV Risk Score and Plan: 3 and Ondansetron, Dexamethasone, Midazolam and Treatment may vary due to age or medical condition  Airway Management Planned: Oral ETT  Additional Equipment:   Intra-op Plan:   Post-operative Plan: Extubation in OR  Informed Consent: I have reviewed the patients History and Physical, chart, labs and discussed the procedure including the risks, benefits and alternatives for the proposed anesthesia with the patient or authorized representative who has indicated his/her understanding and acceptance.     Dental advisory given  Plan Discussed with: CRNA, Anesthesiologist and Surgeon  Anesthesia Plan Comments:         Anesthesia Quick Evaluation

## 2021-07-22 NOTE — Progress Notes (Signed)
Pt advised of her  MRI results.

## 2021-07-22 NOTE — Anesthesia Procedure Notes (Signed)
Procedure Name: Intubation Date/Time: 07/22/2021 8:45 AM Performed by: Bryson Corona, CRNA Pre-anesthesia Checklist: Patient identified, Emergency Drugs available, Suction available and Patient being monitored Patient Re-evaluated:Patient Re-evaluated prior to induction Oxygen Delivery Method: Circle System Utilized Preoxygenation: Pre-oxygenation with 100% oxygen Induction Type: IV induction Ventilation: Mask ventilation without difficulty Laryngoscope Size: Glidescope and 3 Grade View: Grade I Tube type: Oral Tube size: 7.0 mm Number of attempts: 1 Airway Equipment and Method: Stylet Placement Confirmation: ETT inserted through vocal cords under direct vision, positive ETCO2 and breath sounds checked- equal and bilateral Secured at: 21 cm Tube secured with: Tape Dental Injury: Teeth and Oropharynx as per pre-operative assessment  Comments: Elective Glidescope Go intubation in MRI due to backorder of MRI safe batteries for blade.

## 2021-07-22 NOTE — Transfer of Care (Signed)
Immediate Anesthesia Transfer of Care Note  Patient: Gabriela Stephens  Procedure(s) Performed: MRI WITH ANESTHESIA  BRAIN WITH AND [place] WITH AND WITHOUT  Patient Location: PACU  Anesthesia Type:General  Level of Consciousness: drowsy and patient cooperative  Airway & Oxygen Therapy: Patient Spontanous Breathing and Patient connected to face mask oxygen  Post-op Assessment: Report given to RN and Post -op Vital signs reviewed and stable  Post vital signs: Reviewed and stable  Last Vitals:  Vitals Value Taken Time  BP 119/66 07/22/21 1034  Temp    Pulse 100 07/22/21 1039  Resp 17 07/22/21 1039  SpO2 98 % 07/22/21 1039  Vitals shown include unvalidated device data.  Last Pain:  Vitals:   07/22/21 0705  PainSc: 0-No pain         Complications: No notable events documented.

## 2021-07-23 ENCOUNTER — Encounter (HOSPITAL_COMMUNITY): Payer: Self-pay | Admitting: Radiology

## 2021-07-23 MED ORDER — AMOXICILLIN 875 MG PO TABS: 875.0000 mg | ORAL_TABLET | Freq: Two times a day (BID) | ORAL | 0 refills | Status: AC

## 2021-07-23 NOTE — Anesthesia Postprocedure Evaluation (Signed)
Anesthesia Post Note  Patient: Gabriela Stephens  Procedure(s) Performed: MRI WITH ANESTHESIA  BRAIN WITH AND [place] WITH AND WITHOUT     Patient location during evaluation: PACU Anesthesia Type: General Level of consciousness: awake and alert Pain management: pain level controlled Vital Signs Assessment: post-procedure vital signs reviewed and stable Respiratory status: spontaneous breathing, nonlabored ventilation, respiratory function stable and patient connected to nasal cannula oxygen Cardiovascular status: blood pressure returned to baseline and stable Postop Assessment: no apparent nausea or vomiting Anesthetic complications: no   No notable events documented.  Last Vitals:  Vitals:   07/22/21 1050 07/22/21 1105  BP: 120/65 122/76  Pulse: 98 90  Resp: 15 13  Temp:  (!) 36.4 C  SpO2: 96% 99%    Last Pain:  Vitals:   07/22/21 1105  PainSc: 0-No pain                 Harjit Leider S

## 2021-07-23 NOTE — Telephone Encounter (Signed)
Pt had MRI showing sinus infection and pt is asking you to rx something based off imaging

## 2021-08-16 NOTE — Progress Notes (Signed)
NEUROLOGY FOLLOW UP OFFICE NOTE  HERMAN MELL [id number]  Assessment/Plan:   Multiple sclerosis - never on DMT due to concerns of potential side effects.  Clinically doing well.   Continue D3 2000 IU daily Follow up one year     Subjective:  Coretha Creswell Eckrich is a 61 year old right-handed female who follows up for multiple sclerosis.   UPDATE: DMT:  None   MRI of brain and cervical spine with and without contrast on 07/22/2021 personally reviewed were stable compared to prior imaging from 06/05/2018  Vit D level in July was 41.09.  Advised to start D3 2000 IU daily.  Repeat level in December was 48.95.  Marland Kitchen   Vision:  No issues Motor:  No issue Sensory:  No issue Pain: No issues Gait:  good Bowel/bladder:  No issue Fatigue:  no Cognition:  good Mood:  good   HISTORY:  She was diagnosed with multiple sclerosis in 1993, when she had her first flare, presenting as right sided numbness and weakness.  MRI of brain and cervical spine showed lesions.  She may have had a lumbar puncture.  She was treated with IV steroids in the hospital.  Symptoms lasted about 3 months.  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  Symptoms lasted 2 to 3 weeks.  She was again treated with steroids.  She had a second relapse in 1998 in which she had a recurrence of left optic neuritis and numbness and tingling of the extremities.  She was again treated with steroids.  It was recommended that she start Avonex but she deferred.  She was never on disease modifying therapy.  Over the years, she would have occasional periods of lethargy in which she would remain in bed for a few days.  Otherwise, she has not had any other chronic symptoms.  She denies bladder dysfunction, chronic pain or memory deficits.   In September 2018, she began experiencing some numbness, aches and heaviness in her right arm.  She also has been experiencing numbness and  tingling in the hands and feet.  Her balance is also off.  These are symptoms concerning for possible relapse.  Symptoms are unchanged and have not progressed or improved.   I saw patient in 2018.  She had deferred starting a DMT at that time due to concerns of potential side effects.  She was lost to follow up.   In 2022, she began experiencing intermittent burning sensation and itching in her bilateral proximal posterior arms and left lower abdomen.  No rash.  Went to urgent care on 11/14/2020 where she was given Benadryl and Zyrtec for presumed dermatitis.  Symptoms persisted and she went to the ED on 11/27/2020 where she was simply prescribed a 6 day 21 tablet prednisone taper and referred to neurology.    Imaging: 06/05/2018 MRI BRAIN WO:  Stable white matter hyperintensities compatible with history of demyelination and multiple sclerosis. No new lesion identified. 06/05/2018 MRI C-SPINE WO:  1. 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 No new cervical cord lesion identified. 2. Stable mild cervical spondylosis at the C4-5 and C5-6 levels. No high-grade foraminal or canal stenosis. 06/11/2017 MRI BRAIN W WO:  multiple T2 FLAIR  hyperintense lesions in the cerebral/periventricular and pericallosal white matter without enhancement, as well as single lesion in the right cerebellum.  Incidentally, there is small left paramedian parietal developmental venous anomaly and extensive  paranasal sinus disease.   06/11/2017 MRI C-SPINE W NT:[id number] left posterolateral cord lesion at C6 level.   Family history:  Paternal second cousin has MS.  Sister has RA.  Father had ALS.  Mother had Alzheimer's disease.   PAST MEDICAL HISTORY: Past Medical History:  Diagnosis Date   Anemia    during pregancy   COVID 07/2020   hospitalized for 5 days   Hypertension    Multiple sclerosis ([place]) 03/1992    MEDICATIONS: Current Outpatient Medications on File Prior to Visit   Medication Sig Dispense Refill   Biotin 10000 MCG TABS Take 10,000 mcg by mouth daily.     cholecalciferol (VITAMIN D3) 25 MCG (1000 UNIT) tablet Take 1,000 Units by mouth daily.     diphenhydrAMINE (BENADRYL) 50 MG tablet Take 50 mg 1 hour before IV contrast injection 1 tablet 0   hydrOXYzine (ATARAX/VISTARIL) 25 MG tablet Take 1 tablet (25 mg total) by mouth every 6 (six) hours. (Patient taking differently: Take 25 mg by mouth at bedtime.) 120 tablet 3   levocetirizine (XYZAL) 5 MG tablet Take 5 mg by mouth at bedtime.     Liraglutide -Weight Management (SAXENDA) 18 MG/3ML SOPN Inject 1.8 mg into the skin at bedtime.     metoprolol tartrate (LOPRESSOR) 25 MG tablet Take 1 tablet (25 mg total) by mouth daily. 90 tablet 1   Multiple Vitamin (MULTIVITAMIN WITH MINERALS) TABS tablet Take 1 tablet by mouth daily. Woman's 50+     predniSONE (DELTASONE) 50 MG tablet Prednisone 50 mg PO 13, 7 and 1 hour before IV contrast 3 tablet 0   zinc gluconate 50 MG tablet Take 50 mg by mouth daily.     No current facility-administered medications on file prior to visit.    ALLERGIES: Allergies  Allergen Reactions   Gadolinium Other (See Comments)    Hypertension      FAMILY HISTORY: Family History  Problem Relation Age of Onset   Alzheimer's disease Mother    ALS Father       Objective:  Blood pressure 132/82, pulse 78, height 5\' 1"  (1.549 m), weight 216 lb (98 kg), last menstrual period 12/31/2011, SpO2 98 %. General: No acute distress.  Patient appears well-groomed.   Head:  Normocephalic/atraumatic Eyes:  Fundi examined but not visualized Neck: supple, no paraspinal tenderness, full range of motion Heart:  Regular rate and rhythm Lungs:  Clear to auscultation bilaterally Back: No paraspinal tenderness Neurological Exam: alert and oriented to person, place, and time.  Speech fluent and not dysarthric, language intact.  CN II-XII intact. Bulk and tone normal, muscle strength 5/5  throughout.  Sensation to light touch intact.  Deep tendon reflexes 2+ throughout, toes downgoing.  Finger to nose testing intact.  Gait normal, Romberg negative.   Metta Clines, DO  CC: Annye Asa, MD

## 2021-08-17 ENCOUNTER — Ambulatory Visit (INDEPENDENT_AMBULATORY_CARE_PROVIDER_SITE_OTHER): Payer: BC Managed Care – PPO | Admitting: Neurology

## 2021-08-17 ENCOUNTER — Encounter: Payer: Self-pay | Admitting: Neurology

## 2021-08-17 ENCOUNTER — Other Ambulatory Visit: Payer: Self-pay

## 2021-08-17 VITALS — BP 132/82 | HR 78 | Ht 61.0 in | Wt 216.0 lb

## 2021-08-17 DIAGNOSIS — G35 Multiple sclerosis: Secondary | ICD-10-CM | POA: Diagnosis not present

## 2021-08-17 NOTE — Patient Instructions (Signed)
Continue D3 2000 IU daily Follow up one year

## 2021-08-28 ENCOUNTER — Ambulatory Visit
Admission: EM | Admit: 2021-08-28 | Discharge: 2021-08-28 | Disposition: A | Discharge status: Home / Self Care | Payer: BC Managed Care – PPO | Attending: Internal Medicine | Admitting: Internal Medicine

## 2021-08-28 ENCOUNTER — Other Ambulatory Visit: Payer: Self-pay

## 2021-08-28 ENCOUNTER — Encounter: Payer: Self-pay | Admitting: Emergency Medicine

## 2021-08-28 DIAGNOSIS — J069 Acute upper respiratory infection, unspecified: Secondary | ICD-10-CM | POA: Diagnosis not present

## 2021-08-28 MED ORDER — FLUTICASONE PROPIONATE 50 MCG/ACT NA SUSP: 1.0000 | Freq: Every day | NASAL | 0 refills | Status: DC

## 2021-08-28 MED ORDER — BENZONATATE 100 MG PO CAPS: 100.0000 mg | ORAL_CAPSULE | Freq: Three times a day (TID) | ORAL | 0 refills | Status: DC | PRN

## 2021-08-28 NOTE — ED Provider Notes (Signed)
[facility]    CSN: [id number] Arrival date & time: 08/28/21  0951      History   Chief Complaint Chief Complaint  Patient presents with   Cough    HPI Gabriela Stephens is a 61 y.o. female.   Patient presents with nonproductive cough, headache, body aches, nasal congestion that has been present for approximately 4 days.  She does report that she had a sinus infection at beginning of January, but those symptoms have resolved and these are new symptoms.  Denies any known fevers.  She reports that her husband has had similar symptoms recently.  Denies chest pain, shortness of breath, sore throat, ear pain, nausea, vomiting, diarrhea, abdominal pain.   Cough  Past Medical History:  Diagnosis Date   Anemia    during pregancy   COVID 07/2020   hospitalized for 5 days   Hypertension    Multiple sclerosis ([place]) 03/1992    Patient Active Problem List   Diagnosis Date Noted   Paresthesia and pain of both upper extremities 01/11/2021   Change in bowel habit 09/28/2020   Constipation 09/28/2020   Flatulence, eructation and gas pain 09/28/2020   HTN (hypertension) 09/06/2019   Pneumonia due to COVID-19 virus 08/01/2019   Multiple sclerosis ([place]) 05/11/2017   Morbid obesity ([place]) 06/05/2014   Leiomyoma 01/27/2014   Routine general medical examination at a health care facility 02/02/2012   THYROMEGALY 07/26/2010   SHOULDER, PAIN 01/06/2009    Past Surgical History:  Procedure Laterality Date   KNEE CARTILAGE SURGERY     LAPAROSCOPIC ASSISTED VAGINAL HYSTERECTOMY Bilateral 01/27/2014   Procedure: LAPAROSCOPIC ASSISTED VAGINAL HYSTERECTOMY, BILATERAL SALPINGECTOMY;  Surgeon: Margarette Asal, MD;  Location: [facility];  Service: Gynecology;  Laterality: Bilateral;   RADIOLOGY WITH ANESTHESIA N/A 07/22/2021   Procedure: MRI WITH ANESTHESIA  BRAIN WITH AND WITHOUT,CERVICAL WITH AND WITHOUT;  Surgeon: Radiologist, Medication, MD;  Location: [facility];  Service: Radiology;   Laterality: N/A;   TUBAL LIGATION  1986    OB History   No obstetric history on file.      Home Medications    Prior to Admission medications   Medication Sig Start Date End Date Taking? Authorizing Provider  benzonatate (TESSALON) 100 MG capsule Take 1 capsule (100 mg total) by mouth every 8 (eight) hours as needed for cough. 08/28/21  Yes Loyce Flaming, Hildred Alamin E, FNP  fluticasone (FLONASE) 50 MCG/ACT nasal spray Place 1 spray into both nostrils daily for 3 days. 08/28/21 08/31/21 Yes Zada Haser, Michele Rockers, FNP  Biotin 10000 MCG TABS Take 10,000 mcg by mouth daily.    [provider]  cholecalciferol (VITAMIN D3) 25 MCG (1000 UNIT) tablet Take 1,000 Units by mouth daily.    [provider]  hydrOXYzine (ATARAX/VISTARIL) 25 MG tablet Take 1 tablet (25 mg total) by mouth every 6 (six) hours. Patient taking differently: Take 25 mg by mouth at bedtime. 01/11/21   Midge Minium, MD  levocetirizine (XYZAL) 2.5 MG/5ML solution Take 2.5 mg by mouth every evening.    [provider]  Liraglutide -Weight Management (SAXENDA) 18 MG/3ML SOPN Inject 1.8 mg into the skin at bedtime.    [provider]  metoprolol tartrate (LOPRESSOR) 25 MG tablet Take 1 tablet (25 mg total) by mouth daily. 02/24/21   Midge Minium, MD  Multiple Vitamin (MULTIVITAMIN WITH MINERALS) TABS tablet Take 1 tablet by mouth daily. Woman's 50+    [provider]  ondansetron (ZOFRAN-ODT) 4 MG disintegrating tablet  Take 4 mg by mouth daily. 07/19/21   [provider]  zinc gluconate 50 MG tablet Take 50 mg by mouth daily.    [provider]    Family History Family History  Problem Relation Age of Onset   Alzheimer's disease Mother    ALS Father     Social History Social History   Tobacco Use   Smoking status: Former    Types: Cigarettes    Quit date: 1999    Years since quitting: 24.1   Smokeless tobacco: Never  Vaping Use   Vaping Use: Never used  Substance  Use Topics   Alcohol use: Yes    Comment: occasional wine   Drug use: No     Allergies   Gadolinium   Review of Systems Review of Systems Per HPI  Physical Exam Triage Vital Signs ED Triage Vitals  Enc Vitals Group     BP 08/28/21 1057 120/80     Pulse Rate 08/28/21 1057 69     Resp 08/28/21 1057 16     Temp 08/28/21 1057 98.1 F (36.7 C)     Temp Source 08/28/21 1057 Oral     SpO2 08/28/21 1057 97 %     Weight --      Height --      Head Circumference --      Peak Flow --      Pain Score 08/28/21 1056 6     Pain Loc --      Pain Edu? --      Excl. in [place]? --    No data found.  Updated Vital Signs BP 120/80 (BP Location: Left Arm)    Pulse 69    Temp 98.1 F (36.7 C) (Oral)    Resp 16    LMP 12/31/2011    SpO2 97%   Visual Acuity Right Eye Distance:   Left Eye Distance:   Bilateral Distance:    Right Eye Near:   Left Eye Near:    Bilateral Near:     Physical Exam Constitutional:      General: She is not in acute distress.    Appearance: Normal appearance. She is not toxic-appearing or diaphoretic.  HENT:     Head: Normocephalic and atraumatic.     Right Ear: Tympanic membrane and ear canal normal.     Left Ear: Tympanic membrane and ear canal normal.     Nose: Congestion present.     Mouth/Throat:     Mouth: Mucous membranes are moist.     Pharynx: No posterior oropharyngeal erythema.  Eyes:     Extraocular Movements: Extraocular movements intact.     Conjunctiva/sclera: Conjunctivae normal.     Pupils: Pupils are equal, round, and reactive to light.  Cardiovascular:     Rate and Rhythm: Normal rate and regular rhythm.     Pulses: Normal pulses.     Heart sounds: Normal heart sounds.  Pulmonary:     Effort: Pulmonary effort is normal. No respiratory distress.     Breath sounds: Normal breath sounds. No stridor. No wheezing, rhonchi or rales.  Abdominal:     General: Abdomen is flat. Bowel sounds are normal.     Palpations: Abdomen is soft.   Musculoskeletal:        General: Normal range of motion.     Cervical back: Normal range of motion.  Skin:    General: Skin is warm and dry.  Neurological:     General: No  focal deficit present.     Mental Status: She is alert and oriented to person, place, and time. Mental status is at baseline.  Psychiatric:        Mood and Affect: Mood normal.        Behavior: Behavior normal.     UC Treatments / Results  Labs (all labs ordered are listed, but only abnormal results are displayed) Labs Reviewed  COVID-19, FLU A+B NAA    EKG   Radiology No results found.  Procedures Procedures (including critical care time)  Medications Ordered in UC Medications - No data to display  Initial Impression / Assessment and Plan / UC Course  I have reviewed the triage vital signs and the nursing notes.  Pertinent labs & imaging results that were available during my care of the patient were reviewed by me and considered in my medical decision making (see chart for details).     Patient presents with symptoms likely from a viral upper respiratory infection. Differential includes bacterial pneumonia, sinusitis, allergic rhinitis, COVID-19, flu. Do not suspect underlying cardiopulmonary process. Symptoms seem unlikely related to ACS, CHF or COPD exacerbations, pneumonia, pneumothorax. Patient is nontoxic appearing and not in need of emergent medical intervention.  COVID-19 and flu test pending.  Recommended symptom control with over the counter medications.  Patient sent prescriptions.  Return if symptoms fail to improve in 1-2 weeks or you develop shortness of breath, chest pain, severe headache. Patient states understanding and is agreeable.  Discharged with PCP followup.  Final Clinical Impressions(s) / UC Diagnoses   Final diagnoses:  Viral upper respiratory tract infection with cough     Discharge Instructions      It appears that a viral upper respiratory infection should self  resolve in the next few days with symptomatic treatment.  You have been prescribed 2 medications to help alleviate symptoms.  COVID test is pending.  We will call if it is positive.    ED Prescriptions     Medication Sig Dispense Auth. Provider   fluticasone (FLONASE) 50 MCG/ACT nasal spray Place 1 spray into both nostrils daily for 3 days. 16 g Ethin [place], Hildred Alamin E, [place]   benzonatate (TESSALON) 100 MG capsule Take 1 capsule (100 mg total) by mouth every 8 (eight) hours as needed for cough. 21 capsule [place], Michele Rockers, [place]      PDMP not reviewed this encounter.   Teodora Medici, [place] 08/28/21 1149

## 2021-08-28 NOTE — ED Triage Notes (Signed)
Cough, headache, bodyaches, nasal congestion, nausea. Denies sore throat, V/D. Was treated for a sinus infection in January.

## 2021-08-28 NOTE — Discharge Instructions (Signed)
It appears that a viral upper respiratory infection should self resolve in the next few days with symptomatic treatment.  You have been prescribed 2 medications to help alleviate symptoms.  COVID test is pending.  We will call if it is positive.

## 2021-08-30 LAB — COVID-19, FLU A+B NAA
Influenza A, NAA: NOT DETECTED
Influenza B, NAA: NOT DETECTED
SARS-CoV-2, NAA: NOT DETECTED

## 2021-09-27 ENCOUNTER — Encounter: Payer: Self-pay | Admitting: Family Medicine

## 2021-09-27 ENCOUNTER — Other Ambulatory Visit: Payer: Self-pay

## 2021-09-27 MED ORDER — METOPROLOL TARTRATE 25 MG PO TABS: 25.0000 mg | ORAL_TABLET | Freq: Every day | ORAL | 1 refills | Status: DC

## 2021-09-30 IMAGING — CR DG CHEST 2V
2 series · 2 of 2 positions shown · non-contrast
Comparison: 07/31/2011

CLINICAL DATA: Shortness of breath, cough

EXAM:
CHEST - 2 VIEW

[w chest pa]
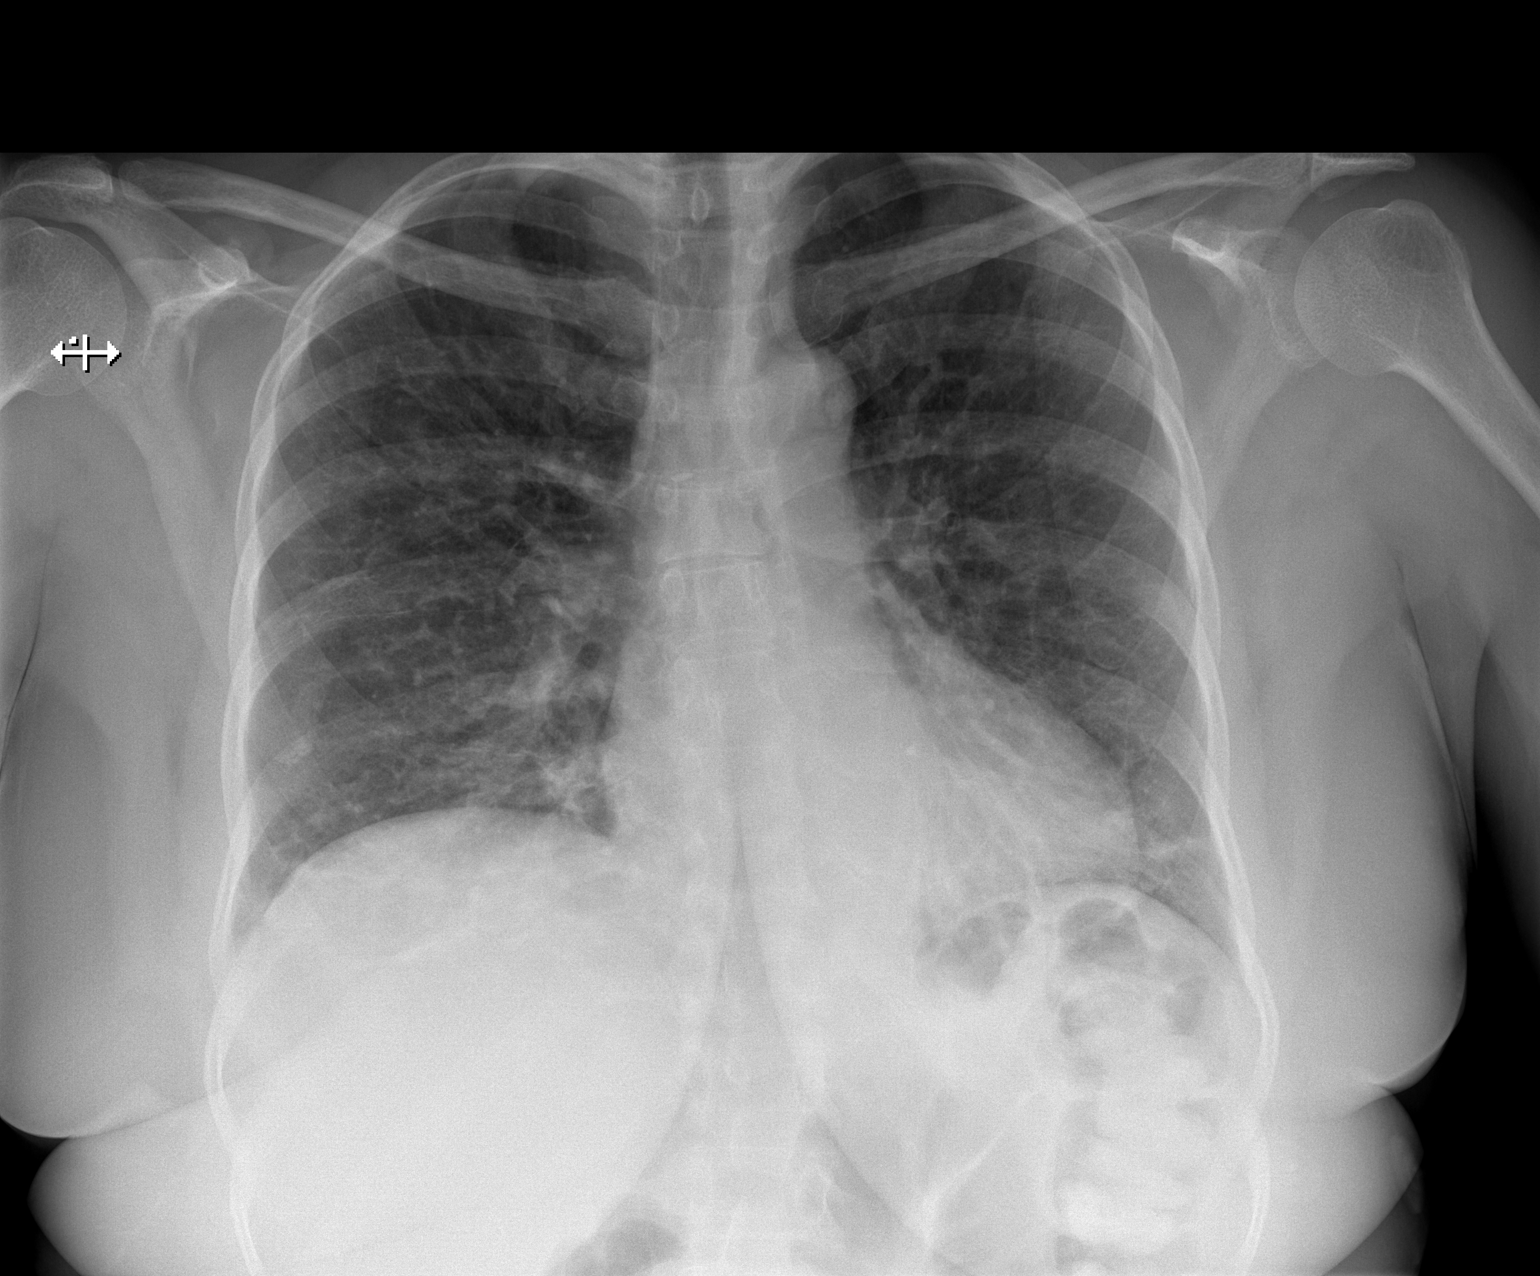

[w chest lat]
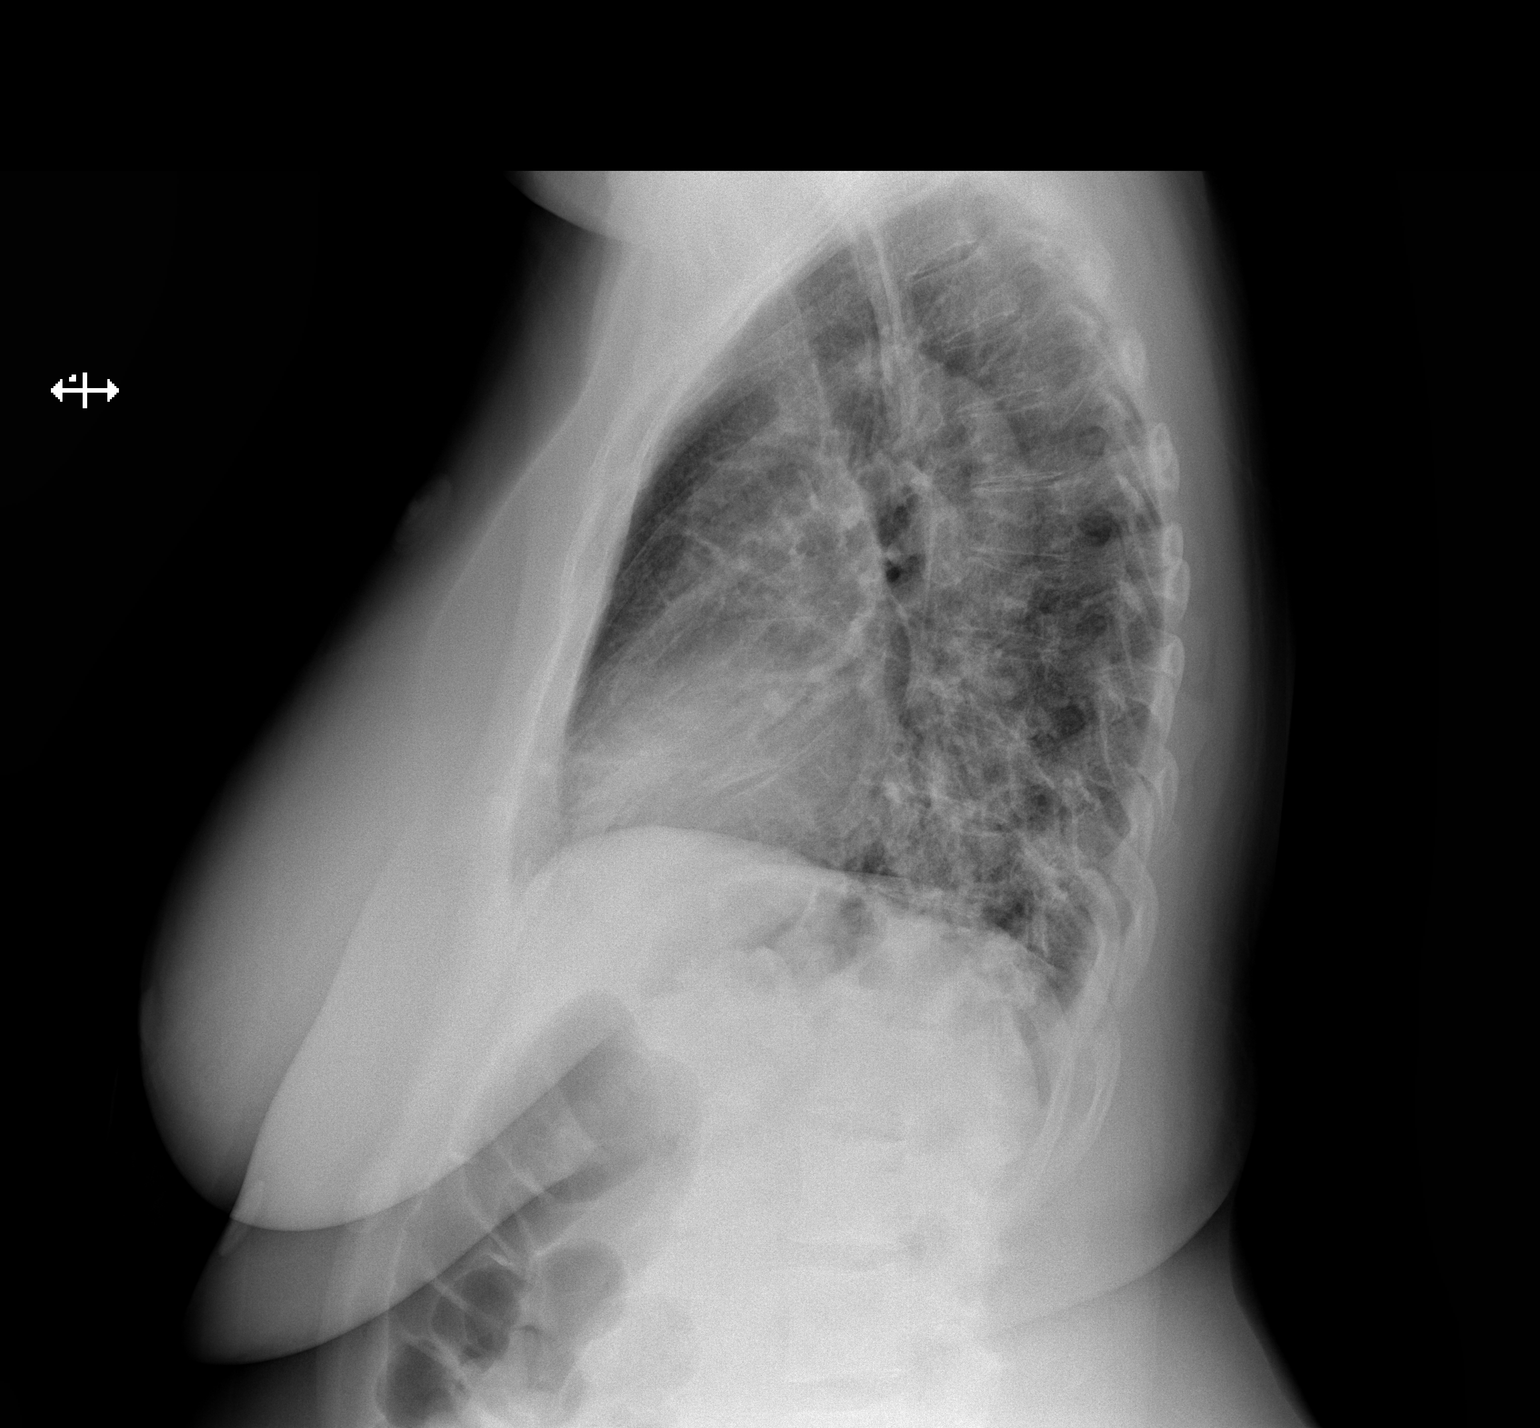

[2 of 2 positions shown; findings below may reference images not displayed]

FINDINGS: Heart is normal size. Patchy bibasilar airspace opacities could
reflect atelectasis or early infiltrates. No effusions. No acute
bony abnormality.
IMPRESSION: Patchy bibasilar atelectasis or early infiltrates.

## 2021-10-02 IMAGING — DX DG CHEST 1V PORT
1 series · 1 of 1 positions shown · non-contrast
Comparison: 07/30/2019

CLINICAL DATA: Covid+ c/o home O2 reading being 77-85% on room air.
Using inhaler that was prescribed yesterday. short of breath, hx MS

EXAM:
PORTABLE CHEST 1 VIEW

[chest ap]
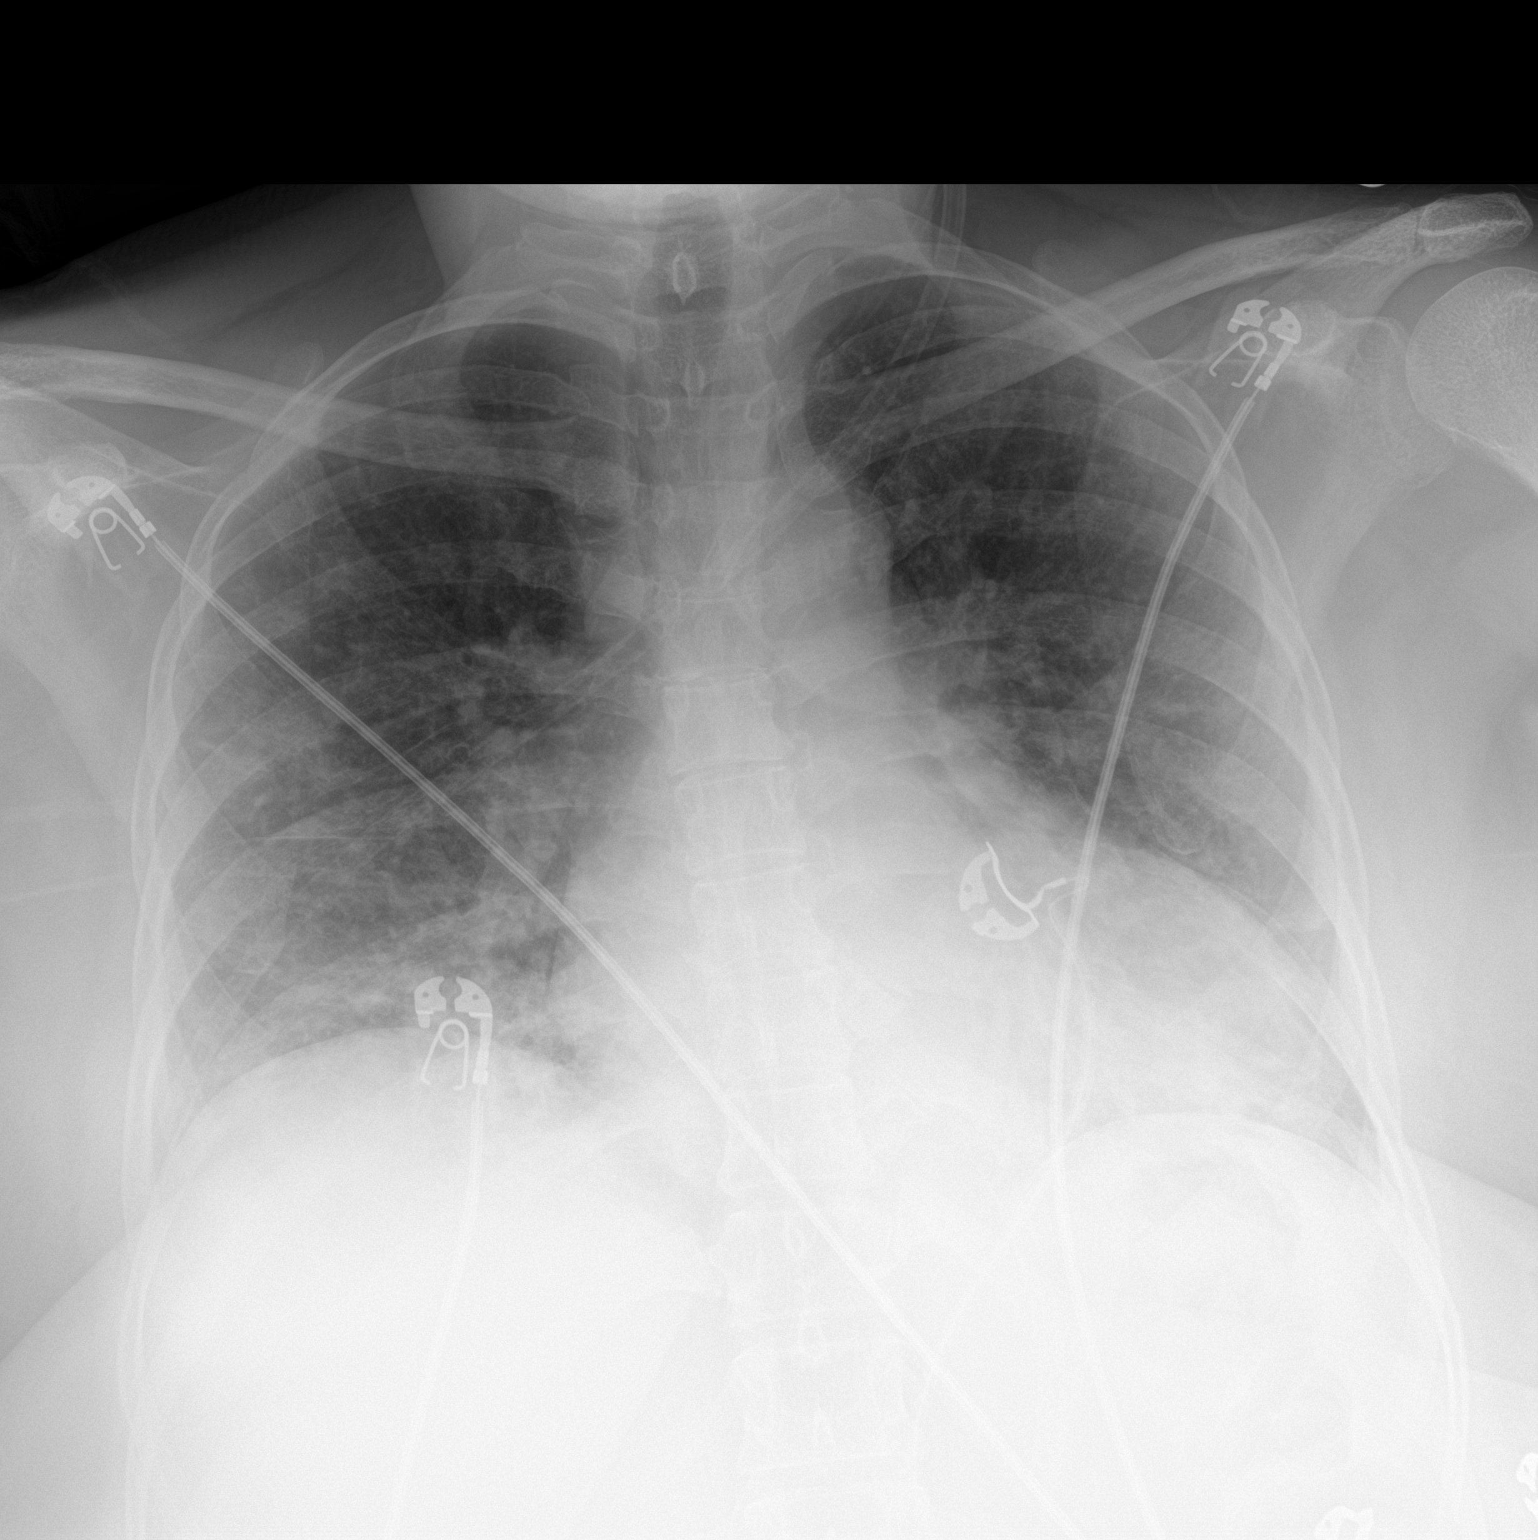

[1 of 1 positions shown; findings below may reference images not displayed]

FINDINGS: Subtle hazy airspace opacities noted bilaterally, without
significant change, consistent with multifocal GVJQI-YK pneumonia.

No evidence of a pleural effusion or pneumothorax.

Cardiac silhouette normal in size.  No mediastinal or hilar masses.

Skeletal structures are grossly intact.
IMPRESSION: 1. Subtle bilateral hazy airspace lung opacities consistent with
GVJQI-YK multifocal pneumonia. Findings similar to the previous
exam.

## 2021-10-24 ENCOUNTER — Encounter: Payer: Self-pay | Admitting: Emergency Medicine

## 2021-10-24 ENCOUNTER — Other Ambulatory Visit: Payer: Self-pay

## 2021-10-24 ENCOUNTER — Ambulatory Visit
Admission: EM | Admit: 2021-10-24 | Discharge: 2021-10-24 | Disposition: A | Discharge status: Home / Self Care | Payer: BC Managed Care – PPO | Attending: Student | Admitting: Student

## 2021-10-24 DIAGNOSIS — B349 Viral infection, unspecified: Secondary | ICD-10-CM

## 2021-10-24 MED ORDER — PROMETHAZINE-DM 6.25-15 MG/5ML PO SYRP: 5.0000 mL | ORAL_SOLUTION | Freq: Four times a day (QID) | ORAL | 0 refills | Status: DC | PRN

## 2021-10-24 MED ORDER — ALBUTEROL SULFATE HFA 108 (90 BASE) MCG/ACT IN AERS: 1.0000 | INHALATION_SPRAY | Freq: Four times a day (QID) | RESPIRATORY_TRACT | 0 refills | Status: DC | PRN

## 2021-10-24 MED ORDER — AMOXICILLIN-POT CLAVULANATE 875-125 MG PO TABS: 1.0000 | ORAL_TABLET | Freq: Two times a day (BID) | ORAL | 0 refills | Status: DC

## 2021-10-24 MED ORDER — PREDNISONE 20 MG PO TABS: 40.0000 mg | ORAL_TABLET | Freq: Every day | ORAL | 0 refills | Status: AC

## 2021-10-24 NOTE — ED Provider Notes (Signed)
?[facility] ? ? ? ?CSN: [id number] ?Arrival date & time: 10/24/21  0834 ? ? ?  ? ?History   ?Chief Complaint ?Chief Complaint  ?Patient presents with  ? Cough  ? ? ?HPI ?Gabriela Stephens is a 61 y.o. female presenting with cough and congestion for 4 days.  History of hospitalization following COVID-19, no formal diagnosis of pulmonary disease but she is a former smoker 24-pack-year history.  Describes cough productive of increasingly purulent sputum, yellow in color.  States she has produced an excessive amount of sputum.  She has baseline dyspnea on exertion, denies increase of this.  Last fever was about 2 days ago.  States has attempted many over-the-counter medications without relief.  She does not use an inhaler at baseline.  Denies current fever/chills, chest pain, shortness of breath, dizziness, weakness, nausea/vomiting/diarrhea. ? ?HPI ? ?Past Medical History:  ?Diagnosis Date  ? Anemia   ? during pregancy  ? COVID 07/2020  ? hospitalized for 5 days  ? Hypertension   ? Multiple sclerosis ([place]) 03/1992  ? ? ?Patient Active Problem List  ? Diagnosis Date Noted  ? Paresthesia and pain of both upper extremities 01/11/2021  ? Change in bowel habit 09/28/2020  ? Constipation 09/28/2020  ? Flatulence, eructation and gas pain 09/28/2020  ? HTN (hypertension) 09/06/2019  ? Pneumonia due to COVID-19 virus 08/01/2019  ? Multiple sclerosis ([place]) 05/11/2017  ? Morbid obesity ([place]) 06/05/2014  ? Leiomyoma 01/27/2014  ? Routine general medical examination at a health care facility 02/02/2012  ? THYROMEGALY 07/26/2010  ? SHOULDER, PAIN 01/06/2009  ? ? ?Past Surgical History:  ?Procedure Laterality Date  ? KNEE CARTILAGE SURGERY    ? LAPAROSCOPIC ASSISTED VAGINAL HYSTERECTOMY Bilateral 01/27/2014  ? Procedure: LAPAROSCOPIC ASSISTED VAGINAL HYSTERECTOMY, BILATERAL SALPINGECTOMY;  Surgeon: Margarette Asal, MD;  Location: [facility];  Service: Gynecology;  Laterality: Bilateral;  ? RADIOLOGY WITH ANESTHESIA N/A  07/22/2021  ? Procedure: MRI WITH ANESTHESIA  BRAIN WITH AND WITHOUT,CERVICAL WITH AND WITHOUT;  Surgeon: Radiologist, Medication, MD;  Location: [facility];  Service: Radiology;  Laterality: N/A;  ? TUBAL LIGATION  1986  ? ? ?OB History   ?No obstetric history on file. ?  ? ? ? ?Home Medications   ? ?Prior to Admission medications   ?Medication Sig Start Date End Date Taking? Authorizing Provider  ?albuterol (VENTOLIN HFA) 108 (90 Base) MCG/ACT inhaler Inhale 1-2 puffs into the lungs every 6 (six) hours as needed for wheezing or shortness of breath. 10/24/21  Yes Hazel Sams, PA-C  ?amoxicillin-clavulanate (AUGMENTIN) 875-125 MG tablet Take 1 tablet by mouth every 12 (twelve) hours. 10/24/21  Yes Hazel Sams, PA-C  ?predniSONE (DELTASONE) 20 MG tablet Take 2 tablets (40 mg total) by mouth daily for 5 days. Take with breakfast or lunch. Avoid NSAIDs (ibuprofen, etc) while taking this medication. 10/24/21 10/29/21 Yes Hazel Sams, PA-C  ?promethazine-dextromethorphan (PROMETHAZINE-DM) 6.25-15 MG/5ML syrup Take 5 mLs by mouth 4 (four) times daily as needed for cough. 10/24/21  Yes Hazel Sams, PA-C  ?benzonatate (TESSALON) 100 MG capsule Take 1 capsule (100 mg total) by mouth every 8 (eight) hours as needed for cough. ?Patient not taking: Reported on 10/24/2021 08/28/21   Teodora Medici, FNP  ?Biotin 10000 MCG TABS Take 10,000 mcg by mouth daily.    [provider]  ?cholecalciferol (VITAMIN D3) 25 MCG (1000 UNIT) tablet Take 1,000 Units by mouth daily.    [provider]  ?fluticasone (FLONASE) 50 MCG/ACT  nasal spray Place 1 spray into both nostrils daily for 3 days. 08/28/21 08/31/21  Teodora Medici, FNP  ?hydrOXYzine (ATARAX/VISTARIL) 25 MG tablet Take 1 tablet (25 mg total) by mouth every 6 (six) hours. ?Patient taking differently: Take 25 mg by mouth at bedtime. 01/11/21   Midge Minium, MD  ?levocetirizine Harlow Ohms) 2.5 MG/5ML solution Take 2.5 mg by mouth every evening.    [provider]  ?Liraglutide -Weight Management (SAXENDA) 18 MG/3ML SOPN Inject 1.8 mg into the skin at bedtime.    [provider]  ?metoprolol tartrate (LOPRESSOR) 25 MG tablet Take 1 tablet (25 mg total) by mouth daily. 09/27/21   Midge Minium, MD  ?Multiple Vitamin (MULTIVITAMIN WITH MINERALS) TABS tablet Take 1 tablet by mouth daily. Woman's 50+    [provider]  ?ondansetron (ZOFRAN-ODT) 4 MG disintegrating tablet Take 4 mg by mouth daily. 07/19/21   [provider]  ?zinc gluconate 50 MG tablet Take 50 mg by mouth daily.    [provider]  ? ? ?Family History ?Family History  ?Problem Relation Age of Onset  ? Alzheimer's disease Mother   ? ALS Father   ? ? ?Social History ?Social History  ? ?Tobacco Use  ? Smoking status: Former  ?  Types: Cigarettes  ?  Quit date: 1999  ?  Years since quitting: 24.2  ? Smokeless tobacco: Never  ?Vaping Use  ? Vaping Use: Never used  ?Substance Use Topics  ? Alcohol use: Yes  ?  Comment: occasional wine  ? Drug use: No  ? ? ? ?Allergies   ?Gadolinium ? ? ?Review of Systems ?Review of Systems  ?Constitutional:  Negative for appetite change, chills and fever.  ?HENT:  Positive for congestion and sore throat. Negative for ear pain, rhinorrhea, sinus pressure and sinus pain.   ?Eyes:  Negative for redness and visual disturbance.  ?Respiratory:  Positive for cough and shortness of breath. Negative for chest tightness and wheezing.   ?Cardiovascular:  Negative for chest pain and palpitations.  ?Gastrointestinal:  Negative for abdominal pain, constipation, diarrhea, nausea and vomiting.  ?Genitourinary:  Negative for dysuria, frequency and urgency.  ?Musculoskeletal:  Negative for myalgias.  ?Neurological:  Negative for dizziness, weakness and headaches.  ?Psychiatric/Behavioral:  Negative for confusion.   ?All other systems reviewed and are negative. ? ? ?Physical Exam ?Triage Vital Signs ?ED Triage Vitals [10/24/21 0846]  ?Enc Vitals Group  ?   BP  (!) 147/79  ?   Pulse Rate (!) 101  ?   Resp 18  ?   Temp 97.8 ?F (36.6 ?C)  ?   Temp Source Oral  ?   SpO2 97 %  ?   Weight   ?   Height   ?   Head Circumference   ?   Peak Flow   ?   Pain Score 5  ?   Pain Loc   ?   Pain Edu?   ?   Excl. in [place]?   ? ?No data found. ? ?Updated Vital Signs ?BP (!) 147/79 (BP Location: Left Arm)   Pulse (!) 101   Temp 97.8 ?F (36.6 ?C) (Oral)   Resp 18   LMP 12/31/2011   SpO2 97%  ? ?Visual Acuity ?Right Eye Distance:   ?Left Eye Distance:   ?Bilateral Distance:   ? ?Right Eye Near:   ?Left Eye Near:    ?Bilateral Near:    ? ?Physical Exam ?Vitals reviewed.  ?  Constitutional:   ?   General: She is not in acute distress. ?   Appearance: Normal appearance. She is not ill-appearing.  ?HENT:  ?   Head: Normocephalic and atraumatic.  ?   Right Ear: Tympanic membrane, ear canal and external ear normal. No tenderness. No middle ear effusion. There is no impacted cerumen. Tympanic membrane is not perforated, erythematous, retracted or bulging.  ?   Left Ear: Tympanic membrane, ear canal and external ear normal. No tenderness.  No middle ear effusion. There is no impacted cerumen. Tympanic membrane is not perforated, erythematous, retracted or bulging.  ?   Nose: Nose normal. No congestion.  ?   Mouth/Throat:  ?   Mouth: Mucous membranes are moist.  ?   Pharynx: Uvula midline. No oropharyngeal exudate or posterior oropharyngeal erythema.  ?   Comments: On exam, uvula is midline, she is tolerating her secretions without difficulty, there is no trismus, no drooling, she has normal phonation ? ?Eyes:  ?   Extraocular Movements: Extraocular movements intact.  ?   Pupils: Pupils are equal, round, and reactive to light.  ?Cardiovascular:  ?   Rate and Rhythm: Normal rate and regular rhythm.  ?   Heart sounds: Normal heart sounds.  ?Pulmonary:  ?   Effort: Pulmonary effort is normal.  ?   Breath sounds: Wheezing present. No decreased breath sounds, rhonchi or rales.  ?   Comments: Faint  expiratory wheezes bilateral lower lung fields. Oxygenating comfortably. ?Abdominal:  ?   Palpations: Abdomen is soft.  ?   Tenderness: There is no abdominal tenderness. There is no guarding or rebound.  ?Lymphadenopath

## 2021-10-24 NOTE — ED Triage Notes (Signed)
Pt here for cough, nasal congestion and URI sx x 4 days  ?

## 2021-10-24 NOTE — Discharge Instructions (Addendum)
-  Start the antibiotic-Augmentin (amoxicillin-clavulanate), 1 pill every 12 hours for 7 days.  You can take this with food like with breakfast and dinner. ?-Prednisone, 2 pills taken at the same time for 5 days in a row.  Try taking this earlier in the day as it can give you energy. Avoid NSAIDs like ibuprofen and alleve while taking this medication as they can increase your risk of stomach upset and even GI bleeding when in combination with a steroid. You can continue tylenol (acetaminophen) up to '1000mg'$  3x daily. ?-Albuterol inhaler as needed for cough, wheezing, shortness of breath, 1 to 2 puffs every 6 hours as needed. ?-Promethazine DM cough syrup for congestion/cough. This could make you drowsy, so take at night before bed. ?-As we do not have x-ray at this facility today, if your symptoms get worse instead of better, I recommend following up for chest x-ray.  This includes coughing up blood or dark sputum, worsening shortness of breath, new chest pain, new fevers, etc. ?

## 2022-01-06 DIAGNOSIS — Z01419 Encounter for gynecological examination (general) (routine) without abnormal findings: Secondary | ICD-10-CM | POA: Diagnosis not present

## 2022-01-06 DIAGNOSIS — Z1272 Encounter for screening for malignant neoplasm of vagina: Secondary | ICD-10-CM | POA: Diagnosis not present

## 2022-01-06 DIAGNOSIS — Z1231 Encounter for screening mammogram for malignant neoplasm of breast: Secondary | ICD-10-CM | POA: Diagnosis not present

## 2022-01-06 DIAGNOSIS — Z1151 Encounter for screening for human papillomavirus (HPV): Secondary | ICD-10-CM | POA: Diagnosis not present

## 2022-01-06 DIAGNOSIS — Z6841 Body Mass Index (BMI) 40.0 and over, adult: Secondary | ICD-10-CM | POA: Diagnosis not present

## 2022-01-19 ENCOUNTER — Encounter: Payer: Self-pay | Admitting: Family Medicine

## 2022-01-19 ENCOUNTER — Other Ambulatory Visit: Payer: Self-pay

## 2022-01-19 DIAGNOSIS — J1282 Pneumonia due to coronavirus disease 2019: Secondary | ICD-10-CM

## 2022-01-19 DIAGNOSIS — L299 Pruritus, unspecified: Secondary | ICD-10-CM

## 2022-01-19 MED ORDER — HYDROXYZINE HCL 25 MG PO TABS: 25.0000 mg | ORAL_TABLET | Freq: Four times a day (QID) | ORAL | 3 refills | Status: DC

## 2022-01-19 MED ORDER — LEVOCETIRIZINE DIHYDROCHLORIDE 2.5 MG/5ML PO SOLN: 2.5000 mg | Freq: Every evening | ORAL | 1 refills | Status: DC

## 2022-01-31 DIAGNOSIS — L821 Other seborrheic keratosis: Secondary | ICD-10-CM | POA: Diagnosis not present

## 2022-01-31 DIAGNOSIS — L503 Dermatographic urticaria: Secondary | ICD-10-CM | POA: Diagnosis not present

## 2022-01-31 DIAGNOSIS — L814 Other melanin hyperpigmentation: Secondary | ICD-10-CM | POA: Diagnosis not present

## 2022-01-31 DIAGNOSIS — D225 Melanocytic nevi of trunk: Secondary | ICD-10-CM | POA: Diagnosis not present

## 2022-02-04 DIAGNOSIS — H5213 Myopia, bilateral: Secondary | ICD-10-CM | POA: Diagnosis not present

## 2022-02-26 ENCOUNTER — Other Ambulatory Visit: Payer: Self-pay | Admitting: Family

## 2022-02-26 ENCOUNTER — Encounter: Payer: Self-pay | Admitting: Family Medicine

## 2022-02-26 DIAGNOSIS — U071 COVID-19: Secondary | ICD-10-CM

## 2022-02-26 MED ORDER — LEVOCETIRIZINE DIHYDROCHLORIDE 2.5 MG/5ML PO SOLN: 2.5000 mg | Freq: Every evening | ORAL | 1 refills | Status: DC

## 2022-02-28 ENCOUNTER — Telehealth: Payer: Self-pay | Admitting: Family Medicine

## 2022-02-28 MED ORDER — LEVOCETIRIZINE DIHYDROCHLORIDE 5 MG PO TABS: 5.0000 mg | ORAL_TABLET | Freq: Every evening | ORAL | 2 refills | Status: DC

## 2022-02-28 NOTE — Telephone Encounter (Signed)
Caller name: Merrilee Seashore (pharmacist @ [facility])  On DPR? :yes/no: No  Call back number: [phone]  Provider they see: Birdie Riddle  Reason for call: Calling b/c xyzal 23m solution was called in instead of 2.5 mg tablets.  Ins will not cover the solution. Please call in xyzal 2.5 mg tablets instead.

## 2022-03-02 ENCOUNTER — Telehealth: Payer: Self-pay

## 2022-03-02 NOTE — Telephone Encounter (Signed)
I have started a PA for pt Rx Levocetirzine Dihydrochloride solution . Waiting on Cover my meds to call me back because I answered the clinical questions and it would not send to plan after that .

## 2022-03-02 NOTE — Telephone Encounter (Signed)
Informed pt that her Levocetirzine Dihydrochloride  was approved .

## 2022-04-21 DIAGNOSIS — L820 Inflamed seborrheic keratosis: Secondary | ICD-10-CM | POA: Diagnosis not present

## 2022-04-21 DIAGNOSIS — R58 Hemorrhage, not elsewhere classified: Secondary | ICD-10-CM | POA: Diagnosis not present

## 2022-04-21 DIAGNOSIS — L538 Other specified erythematous conditions: Secondary | ICD-10-CM | POA: Diagnosis not present

## 2022-05-26 ENCOUNTER — Encounter: Payer: Self-pay | Admitting: Family Medicine

## 2022-05-31 ENCOUNTER — Encounter: Payer: Self-pay | Admitting: Family Medicine

## 2022-05-31 ENCOUNTER — Ambulatory Visit (INDEPENDENT_AMBULATORY_CARE_PROVIDER_SITE_OTHER): Payer: BC Managed Care – PPO | Admitting: Family Medicine

## 2022-05-31 DIAGNOSIS — E785 Hyperlipidemia, unspecified: Secondary | ICD-10-CM | POA: Insufficient documentation

## 2022-05-31 DIAGNOSIS — I10 Essential (primary) hypertension: Secondary | ICD-10-CM

## 2022-05-31 MED ORDER — SAXENDA 18 MG/3ML ~~LOC~~ SOPN: PEN_INJECTOR | SUBCUTANEOUS | 0 refills | Status: DC

## 2022-05-31 NOTE — Assessment & Plan Note (Signed)
Chronic problem.  Currently controlled w/ Metoprolol but pt is hoping that with weight loss she would be able to stop meds entirely.

## 2022-05-31 NOTE — Patient Instructions (Signed)
Follow up as scheduled or as needed We'll send in the prior authorization for the [place] and see if we can get it approved Keep up the good work on healthy diet and regular exercise- you're doing great! Call with any questions or concerns Stay Safe!  Stay Healthy! Happy Holidays!!!

## 2022-05-31 NOTE — Progress Notes (Signed)
   Subjective:    Patient ID: Gabriela Stephens, female    DOB: 09-09-1960, 61 y.o.   MRN: [id number]  HPI Morbid obesity- pt was previously doing [place] through weight loss clinic but they indicated they could no longer provide samples and did not file for insurance coverage.  Pt was successful and lost 23 lbs while taking the medication.  Pt was initially having nausea on medication but this resolved.  No abd pain, vomiting.  Pt is hoping to get off BP medication with continued weight loss.  She also has elevated lipids w/ LDL of 135.  Attempting to control w/ diet and exercise rather than medication.  Current BMI 41   Review of Systems For ROS see HPI     Objective:   Physical Exam Vitals reviewed.  Constitutional:      General: She is not in acute distress.    Appearance: Normal appearance. She is well-developed. She is obese. She is not ill-appearing.  HENT:     Head: Normocephalic and atraumatic.  Eyes:     Conjunctiva/sclera: Conjunctivae normal.     Pupils: Pupils are equal, round, and reactive to light.  Neck:     Thyroid: No thyromegaly.  Cardiovascular:     Rate and Rhythm: Normal rate and regular rhythm.     Pulses: Normal pulses.     Heart sounds: Normal heart sounds. No murmur heard. Pulmonary:     Effort: Pulmonary effort is normal. No respiratory distress.     Breath sounds: Normal breath sounds.  Abdominal:     General: There is no distension.     Palpations: Abdomen is soft.     Tenderness: There is no abdominal tenderness.  Musculoskeletal:     Cervical back: Normal range of motion and neck supple.     Right lower leg: No edema.     Left lower leg: No edema.  Lymphadenopathy:     Cervical: No cervical adenopathy.  Skin:    General: Skin is warm and dry.  Neurological:     General: No focal deficit present.     Mental Status: She is alert and oriented to person, place, and time.  Psychiatric:        Mood and Affect: Mood normal.        Behavior:  Behavior normal.           Assessment & Plan:

## 2022-05-31 NOTE — Assessment & Plan Note (Signed)
New.  Pt's labs were on the cusp of needing a statin at last CPE.  She is hoping to control w/ diet and exercise.  Will continue to follow.

## 2022-05-31 NOTE — Assessment & Plan Note (Signed)
Ongoing issue for pt.  She had previously lost 23 lbs while on Saxenda but then had to stop medication due to insurance coverage.  She is hoping to resume medication since she has BMI of 41 and weight related comorbidities such as HTN and hyperlipidemia.  Will attempt to get Saxenda covered so pt can get back on track.

## 2022-06-08 ENCOUNTER — Telehealth: Payer: Self-pay | Admitting: Family Medicine

## 2022-06-08 NOTE — Telephone Encounter (Signed)
Initial Comment Caller is calling from [employer] Caller is calling about pt medication Kirke Shaggy is not covered. Pharmacy Name [facility] Pharmacist Name [place] Number [phone] Translation No Nurse Assessment Nurse: Lavina Hamman, RN, Thomasena Edis Date/Time Eilene Ghazi Time): 06/07/2022 5:15:13 PM Please select the assessment type ---RX called in but not at [facility] Additional Documentation ---Pharmacist states Kirke Shaggy is not covered but Trulicity/ Ozempic are covered with prior auth. Has the office closed within the last 30 minutes? ---No Does the client directives allow for assistance with medications after hours? ---No Disp. Time Eilene Ghazi Time) Disposition Final User 06/07/2022 5:17:05 PM Pharmacy Call Lavina Hamman, RN, Thomasena Edis Reason: See notes 06/07/2022 5:17:13 PM Clinical Call Yes Lavina Hamman, RN, Thomasena Edis Final Disposition 06/07/2022 5:17:13 PM Clinical Call Yes Lavina Hamman, RN, Thomasena Edis

## 2022-06-08 NOTE — Telephone Encounter (Signed)
Gabriela Stephens is not covered the message states Trulicity or Ozempic would be covered with an Auth. Please advise

## 2022-06-13 ENCOUNTER — Other Ambulatory Visit: Payer: Self-pay

## 2022-06-13 ENCOUNTER — Other Ambulatory Visit (HOSPITAL_COMMUNITY): Payer: Self-pay

## 2022-06-13 MED ORDER — LEVOCETIRIZINE DIHYDROCHLORIDE 5 MG PO TABS: 5.0000 mg | ORAL_TABLET | Freq: Every evening | ORAL | 2 refills | Status: DC

## 2022-06-14 ENCOUNTER — Telehealth: Payer: Self-pay

## 2022-06-14 ENCOUNTER — Other Ambulatory Visit (HOSPITAL_COMMUNITY): Payer: Self-pay

## 2022-06-14 MED ORDER — TRULICITY 0.75 MG/0.5ML ~~LOC~~ SOAJ: 0.7500 mg | SUBCUTANEOUS | 1 refills | Status: DC

## 2022-06-14 NOTE — Telephone Encounter (Signed)
Started prior authorization for Trulicity  

## 2022-06-14 NOTE — Telephone Encounter (Addendum)
Pharmacy Patient Advocate Encounter  Received notification from [place] that the request for prior authorization for Trulicity 0.'75mg'$ /0.34m has been denied due to .    Pharmacy Patient Advocate Encounter   [facility] for Trulicity 0.'75mg'$ /0.581mis required/requested.   PA submitted on 06/14/2022 via CoverMyMeds Key  [facility]  AyJoneen BoersCPCampobelloatient Advocate Specialist CoGolden Hillsatient Advocate Team Direct Number: [phone]: [phone]

## 2022-06-14 NOTE — Addendum Note (Signed)
Addended by: Midge Minium on: 06/14/2022 07:23 AM   Modules accepted: Orders

## 2022-06-14 NOTE — Telephone Encounter (Signed)
Informed pt that we sent in Trulicity I did explain that we have not any luck getting it approved without a DX of Diabetes . She expressed verbal understanding

## 2022-06-14 NOTE — Telephone Encounter (Signed)
I have not had luck getting Trulicity or Ozempic approved for anyone without diabetes but I am willing to send in the prescription and see if they approve it.

## 2022-06-15 NOTE — Telephone Encounter (Signed)
Patient is aware 

## 2022-06-15 NOTE — Telephone Encounter (Signed)
Please let pt know that medication was denied as she doesn't have diabetes (I cautioned her that this would most likely be the case).  Since they won't cover Saxenda, at this time there are no alternatives

## 2022-06-15 NOTE — Telephone Encounter (Signed)
Pharmacy has asked that you review denial and advise

## 2022-07-12 ENCOUNTER — Encounter: Payer: BC Managed Care – PPO | Admitting: Family Medicine

## 2022-07-22 ENCOUNTER — Other Ambulatory Visit: Payer: Self-pay

## 2022-07-22 MED ORDER — METOPROLOL TARTRATE 25 MG PO TABS: 25.0000 mg | ORAL_TABLET | Freq: Every day | ORAL | 1 refills | Status: DC

## 2022-07-27 ENCOUNTER — Ambulatory Visit (INDEPENDENT_AMBULATORY_CARE_PROVIDER_SITE_OTHER): Payer: BC Managed Care – PPO | Admitting: Family Medicine

## 2022-07-27 ENCOUNTER — Encounter: Payer: Self-pay | Admitting: Family Medicine

## 2022-07-27 VITALS — BP 130/82 | HR 78 | Temp 98.8°F | Resp 17 | Ht 61.0 in | Wt 220.4 lb

## 2022-07-27 DIAGNOSIS — G35 Multiple sclerosis: Secondary | ICD-10-CM | POA: Diagnosis not present

## 2022-07-27 DIAGNOSIS — Z0000 Encounter for general adult medical examination without abnormal findings: Secondary | ICD-10-CM

## 2022-07-27 NOTE — Progress Notes (Unsigned)
   Subjective:    Patient ID: Gabriela Stephens, female    DOB: 1961-02-14, 62 y.o.   MRN: [id number]  HPI CPE- has pap and mammo scheduled for June (done June 2023 at [facility]), UTD on colonoscopy.  Pt reports feeling well.  Patient Care Team    Relationship Specialty Notifications Start End  Midge Minium, MD PCP - General   06/30/10   Pieter Partridge, DO Consulting Physician Neurology  04/15/20   Kerry Dory, NP Nurse Practitioner Nurse Practitioner  04/15/20     Health Maintenance  Topic Date Due   MAMMOGRAM  06/15/2021   PAP SMEAR-Modifier  06/15/2022   Zoster Vaccines- Shingrix (1 of 2) 08/31/2022 (Originally 06/01/2011)   INFLUENZA VACCINE  10/16/2022 (Originally 02/15/2022)   COLONOSCOPY (Pts 45-26yr Insurance coverage will need to be confirmed)  06/11/2023   HIV Screening  Completed   HPV VACCINES  Aged Out   DTaP/Tdap/Td  Discontinued   COVID-19 Vaccine  Discontinued   Hepatitis C Screening  Discontinued     Review of Systems Patient reports no vision/ hearing changes, adenopathy,fever, weight change,  persistant/recurrent hoarseness , swallowing issues, chest pain, palpitations, edema, persistant/recurrent cough, hemoptysis, dyspnea (rest/exertional/paroxysmal nocturnal), gastrointestinal bleeding (melena, rectal bleeding), abdominal pain, significant heartburn, bowel changes, GU symptoms (dysuria, hematuria, incontinence), Gyn symptoms (abnormal  bleeding, pain),  syncope, focal weakness, memory loss, numbness & tingling, skin/hair/nail changes, abnormal bruising or bleeding, anxiety, or depression.     Objective:   Physical Exam General Appearance:    Alert, cooperative, no distress, appears stated age, obese  Head:    Normocephalic, without obvious abnormality, atraumatic  Eyes:    PERRL, conjunctiva/corneas clear, EOM's intact both eyes  Ears:    Normal TM's and external ear canals, both ears  Nose:   Nares normal, septum midline, mucosa  normal, no drainage    or sinus tenderness  Throat:   Lips, mucosa, and tongue normal; teeth and gums normal  Neck:   Supple, symmetrical, trachea midline, no adenopathy;    Thyroid: no enlargement/tenderness/nodules  Back:     Symmetric, no curvature, ROM normal, no CVA tenderness  Lungs:     Clear to auscultation bilaterally, respirations unlabored  Chest Wall:    No tenderness or deformity   Heart:    Regular rate and rhythm, S1 and S2 normal, no murmur, rub   or gallop  Breast Exam:    Deferred to GYN  Abdomen:     Soft, non-tender, bowel sounds active all four quadrants,    no masses, no organomegaly  Genitalia:    Deferred to GYN  Rectal:    Extremities:   Extremities normal, atraumatic, no cyanosis or edema  Pulses:   2+ and symmetric all extremities  Skin:   Skin color, texture, turgor normal, no rashes or lesions  Lymph nodes:   Cervical, supraclavicular, and axillary nodes normal  Neurologic:   CNII-XII intact, normal strength, sensation and reflexes    throughout          Assessment & Plan:

## 2022-07-27 NOTE — Patient Instructions (Signed)
Follow up in 1 year or as needed We'll notify you of your lab results and make any changes if needed Continue to work on healthy diet and regular exercise- you can do it! Call with any questions or concerns Stay Safe!  Stay Healthy! Happy New Years!

## 2022-07-27 NOTE — Assessment & Plan Note (Signed)
Chronic problem, following w/ Neuro.  Not currently on medication.

## 2022-07-28 DIAGNOSIS — Z13820 Encounter for screening for osteoporosis: Secondary | ICD-10-CM | POA: Diagnosis not present

## 2022-07-28 LAB — VITAMIN D 25 HYDROXY (VIT D DEFICIENCY, FRACTURES): VITD: 35.95 ng/mL (ref 30.00–100.00)

## 2022-07-28 LAB — BASIC METABOLIC PANEL
BUN: 17 mg/dL (ref 6–23)
CO2: 29 mEq/L (ref 19–32)
Calcium: 9.9 mg/dL (ref 8.4–10.5)
Chloride: 105 mEq/L (ref 96–112)
Creatinine, Ser: 0.93 mg/dL (ref 0.40–1.20)
GFR: 66.5 mL/min (ref >60.00)
Glucose, Bld: 80 mg/dL (ref 70–99)
Potassium: 4.6 mEq/L (ref 3.5–5.1)
Sodium: 141 mEq/L (ref 135–145)

## 2022-07-28 LAB — CBC WITH DIFFERENTIAL/PLATELET
Basophils Absolute: 0.1 10*3/uL (ref 0.0–0.1)
Basophils Relative: 0.7 % (ref 0.0–3.0)
Eosinophils Absolute: 0.5 10*3/uL (ref 0.0–0.7)
Eosinophils Relative: 4.8 % (ref 0.0–5.0)
HCT: 42.3 % (ref 36.0–46.0)
Hemoglobin: 13.8 g/dL (ref 12.0–15.0)
Lymphocytes Relative: 36.6 % (ref 12.0–46.0)
Lymphs Abs: 3.5 10*3/uL (ref 0.7–4.0)
MCHC: 32.5 g/dL (ref 30.0–36.0)
MCV: 92.5 fl (ref 78.0–100.0)
Monocytes Absolute: 0.6 10*3/uL (ref 0.1–1.0)
Monocytes Relative: 6.5 % (ref 3.0–12.0)
Neutro Abs: 4.9 10*3/uL (ref 1.4–7.7)
Neutrophils Relative %: 51.4 % (ref 43.0–77.0)
Platelets: 443 10*3/uL — ABNORMAL HIGH (ref 150.0–400.0)
RBC: 4.58 Mil/uL (ref 3.87–5.11)
RDW: 14 % (ref 11.5–15.5)
WBC: 9.6 10*3/uL (ref 4.0–10.5)

## 2022-07-28 LAB — HEPATIC FUNCTION PANEL
ALT: 18 U/L (ref 0–35)
AST: 24 U/L (ref 0–37)
Albumin: 4.6 g/dL (ref 3.5–5.2)
Alkaline Phosphatase: 105 U/L (ref 39–117)
Bilirubin, Direct: 0 mg/dL (ref 0.0–0.3)
Total Bilirubin: 0.2 mg/dL (ref 0.2–1.2)
Total Protein: 7.8 g/dL (ref 6.0–8.3)

## 2022-07-28 LAB — HEMOGLOBIN A1C: Hgb A1c MFr Bld: 5.9 % (ref 4.6–6.5)

## 2022-07-28 LAB — LIPID PANEL
Cholesterol: 217 mg/dL — ABNORMAL HIGH (ref 0–200)
HDL: 54.2 mg/dL (ref >39.00)
LDL Cholesterol: 140 mg/dL — ABNORMAL HIGH (ref 0–99)
NonHDL: 162.52
Total CHOL/HDL Ratio: 4
Triglycerides: 115 mg/dL (ref 0.0–149.0)
VLDL: 23 mg/dL (ref 0.0–40.0)

## 2022-07-28 LAB — TSH: TSH: 0.51 u[IU]/mL (ref 0.35–5.50)

## 2022-07-28 NOTE — Assessment & Plan Note (Signed)
Ongoing issue for pt.  Stressed need for low carb diet and regular exercise.  Check labs to risk stratify.  Will follow.

## 2022-07-28 NOTE — Assessment & Plan Note (Signed)
Pt's PE WNL w/ exception of obesity.  UTD on pap, mammo, colonoscopy.  Check labs.  Anticipatory guidance provided.

## 2022-07-29 ENCOUNTER — Telehealth: Payer: Self-pay

## 2022-07-29 NOTE — Telephone Encounter (Signed)
Pt seen results Via my chart  

## 2022-07-29 NOTE — Telephone Encounter (Signed)
-----  Message from Midge Minium, MD sent at 07/29/2022  7:27 AM EST ----- Labs look great w/ exception of total cholesterol and LDL (bad cholesterol).  These are above goal.  They will improve w/ healthy diet and regular exercise.  Thankfully the ratio of good to bad is still within range and we don't need to start prescription meds at this time, but we may need to in the future if they continue to climb

## 2022-08-16 NOTE — Progress Notes (Unsigned)
NEUROLOGY FOLLOW UP OFFICE NOTE  Gabriela Stephens [id number]  Assessment/Plan:   Multiple sclerosis - never on DMT due to concerns of potential side effects.  Clinically doing well.   Increase D3 to 4000 IU daily Follow up one year     Subjective:  Gabriela Stephens is a 62 year old right-handed female who follows up for multiple sclerosis.   UPDATE: DMT:  None Other:  D3 2000 IU daily  07/27/2022 LABS:  CBC with WBC 9.6, HGB 13.8, HCT 42.3, PLT 443; BMP with Na 141, K 4.6, Cl 105, CO2 29, glucose 80, BUN 17, CR 0.93; Hepatic panel with t bili 0.2, ALP 105, AST 24, ALT 18; vit D 35.95   Vision:  No issues Motor:  No issue Sensory:  No issue Pain: No issues Gait:  good Bowel/bladder:  No issue Fatigue:  no Cognition:  good Mood:  good   HISTORY:  She was diagnosed with multiple sclerosis in 1993, when she had her first flare, presenting as right sided numbness and weakness.  MRI of brain and cervical spine showed lesions.  She may have had a lumbar puncture.  She was treated with IV steroids in the hospital.  Symptoms lasted about 3 months.  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  Symptoms lasted 2 to 3 weeks.  She was again treated with steroids.  She had a second relapse in 1998 in which she had a recurrence of left optic neuritis and numbness and tingling of the extremities.  She was again treated with steroids.  It was recommended that she start Avonex but she deferred.  She was never on disease modifying therapy.  Over the years, she would have occasional periods of lethargy in which she would remain in bed for a few days.  Otherwise, she has not had any other chronic symptoms.  She denies bladder dysfunction, chronic pain or memory deficits.   In September 2018, she began experiencing some numbness, aches and heaviness in her right arm.  She also has been experiencing numbness and tingling in the hands and feet.   Her balance is also off.  These are symptoms concerning for possible relapse.  Symptoms are unchanged and have not progressed or improved.   I saw patient in 2018.  She had deferred starting a DMT at that time due to concerns of potential side effects.  She was lost to follow up.   In 2022, she began experiencing intermittent burning sensation and itching in her bilateral proximal posterior arms and left lower abdomen.  No rash.  Went to urgent care on 11/14/2020 where she was given Benadryl and Zyrtec for presumed dermatitis.  Symptoms persisted and she went to the ED on 11/27/2020 where she was simply prescribed a 6 day 21 tablet prednisone taper and referred to neurology.    Imaging: 07/22/2021 MRI BRAIN & C-SPINE: stable compared to prior imaging from 06/05/2018 06/05/2018 MRI BRAIN WO:  Stable white matter hyperintensities compatible with history of demyelination and multiple sclerosis. No new lesion identified. 06/05/2018 MRI C-SPINE WO:  1. 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 No new cervical cord lesion identified.  2. Stable mild cervical spondylosis at the C4-5 and C5-6 levels. No high-grade foraminal or canal stenosis. 06/11/2017 MRI BRAIN W WO:  multiple T2 FLAIR  hyperintense lesions in the cerebral/periventricular and pericallosal white matter without enhancement, as well as single lesion in  the right cerebellum.  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   06/11/2017 MRI C-SPINE W TD:[id number] left posterolateral cord lesion at C6 level.   Family history:  Paternal second cousin has MS.  Sister has RA.  Father had ALS.  Mother had Alzheimer's disease.   PAST MEDICAL HISTORY: Past Medical History:  Diagnosis Date   Anemia    during pregancy   COVID 07/2020   hospitalized for 5 days   Hypertension    Multiple sclerosis ([place]) 03/1992     MEDICATIONS: Current Outpatient Medications on File Prior to Visit  Medication Sig Dispense Refill   cholecalciferol (VITAMIN D3) 25 MCG (1000 UNIT) tablet Take 1,000 Units by mouth daily.     hydrOXYzine (ATARAX) 25 MG tablet Take 1 tablet (25 mg total) by mouth every 6 (six) hours. 120 tablet 3   levocetirizine (XYZAL ALLERGY 24HR) 5 MG tablet Take 1 tablet (5 mg total) by mouth every evening. Patient is to take 0.5-1 tablet daily 30 tablet 2   metoprolol tartrate (LOPRESSOR) 25 MG tablet Take 1 tablet (25 mg total) by mouth daily. 90 tablet 1   Multiple Vitamin (MULTIVITAMIN WITH MINERALS) TABS tablet Take 1 tablet by mouth daily. Woman's 50+     zinc gluconate 50 MG tablet Take 50 mg by mouth daily.     No current facility-administered medications on file prior to visit.    ALLERGIES: Allergies  Allergen Reactions   Gadolinium Other (See Comments)    Hypertension      FAMILY HISTORY: Family History  Problem Relation Age of Onset   Alzheimer's disease Mother    ALS Father       Objective:  Blood pressure (!) 159/79, pulse 86, resp. rate 18, height '5\' 1"'$  (1.549 m), weight 222 lb (100.7 kg), last menstrual period 10/17/2011, SpO2 97 %. General: No acute distress.  Patient appears well-groomed.   Head:  Normocephalic/atraumatic Eyes:  Fundi examined but not visualized Neck: supple, no paraspinal tenderness, full range of motion Heart:  Regular rate and rhythm Neurological Exam: alert and oriented to person, place, and time.  Speech fluent and not dysarthric, language intact.  CN II-XII intact. Bulk and tone normal, muscle strength 5/5 throughout.  Sensation to temperature and vibration intact.  Deep tendon reflexes 1+ throughout.  Finger to nose testing intact.  Gait normal, Romberg negative.   Metta Clines, DO  CC: Annye Asa, MD

## 2022-08-17 ENCOUNTER — Ambulatory Visit (INDEPENDENT_AMBULATORY_CARE_PROVIDER_SITE_OTHER): Payer: BC Managed Care – PPO | Admitting: Neurology

## 2022-08-17 ENCOUNTER — Encounter: Payer: Self-pay | Admitting: Neurology

## 2022-08-17 VITALS — BP 159/79 | HR 86 | Resp 18 | Ht 61.0 in | Wt 222.0 lb

## 2022-08-17 DIAGNOSIS — G35 Multiple sclerosis: Secondary | ICD-10-CM | POA: Diagnosis not present

## 2022-08-17 NOTE — Patient Instructions (Signed)
Increase D3 to 4000 IU daily

## 2022-09-22 ENCOUNTER — Encounter: Payer: Self-pay | Admitting: Family Medicine

## 2022-09-22 ENCOUNTER — Other Ambulatory Visit: Payer: Self-pay

## 2022-09-22 DIAGNOSIS — L299 Pruritus, unspecified: Secondary | ICD-10-CM

## 2022-09-22 MED ORDER — LEVOCETIRIZINE DIHYDROCHLORIDE 5 MG PO TABS: 5.0000 mg | ORAL_TABLET | Freq: Every evening | ORAL | 0 refills | Status: DC

## 2022-10-03 ENCOUNTER — Encounter: Payer: Self-pay | Admitting: Family Medicine

## 2022-10-07 ENCOUNTER — Encounter: Payer: Self-pay | Admitting: Family Medicine

## 2022-12-23 ENCOUNTER — Other Ambulatory Visit: Payer: Self-pay | Admitting: Family Medicine

## 2022-12-23 DIAGNOSIS — L299 Pruritus, unspecified: Secondary | ICD-10-CM

## 2023-01-09 DIAGNOSIS — H5213 Myopia, bilateral: Secondary | ICD-10-CM | POA: Diagnosis not present

## 2023-01-11 DIAGNOSIS — Z6841 Body Mass Index (BMI) 40.0 and over, adult: Secondary | ICD-10-CM | POA: Diagnosis not present

## 2023-01-11 DIAGNOSIS — Z1231 Encounter for screening mammogram for malignant neoplasm of breast: Secondary | ICD-10-CM | POA: Diagnosis not present

## 2023-01-11 DIAGNOSIS — Z01419 Encounter for gynecological examination (general) (routine) without abnormal findings: Secondary | ICD-10-CM | POA: Diagnosis not present

## 2023-01-25 ENCOUNTER — Other Ambulatory Visit: Payer: Self-pay

## 2023-01-25 MED ORDER — METOPROLOL TARTRATE 25 MG PO TABS: 25.0000 mg | ORAL_TABLET | Freq: Every day | ORAL | 1 refills | Status: DC

## 2023-01-30 ENCOUNTER — Encounter: Payer: Self-pay | Admitting: Family Medicine

## 2023-01-31 MED ORDER — WEGOVY 0.25 MG/0.5ML ~~LOC~~ SOAJ: 0.2500 mg | SUBCUTANEOUS | 1 refills | Status: DC

## 2023-02-03 DIAGNOSIS — D225 Melanocytic nevi of trunk: Secondary | ICD-10-CM | POA: Diagnosis not present

## 2023-02-03 DIAGNOSIS — L821 Other seborrheic keratosis: Secondary | ICD-10-CM | POA: Diagnosis not present

## 2023-02-03 DIAGNOSIS — L814 Other melanin hyperpigmentation: Secondary | ICD-10-CM | POA: Diagnosis not present

## 2023-02-03 DIAGNOSIS — L728 Other follicular cysts of the skin and subcutaneous tissue: Secondary | ICD-10-CM | POA: Diagnosis not present

## 2023-02-17 ENCOUNTER — Other Ambulatory Visit: Payer: Self-pay

## 2023-02-17 DIAGNOSIS — L299 Pruritus, unspecified: Secondary | ICD-10-CM

## 2023-02-17 MED ORDER — HYDROXYZINE HCL 25 MG PO TABS: 25.0000 mg | ORAL_TABLET | Freq: Four times a day (QID) | ORAL | 1 refills | Status: DC

## 2023-03-15 ENCOUNTER — Encounter: Payer: Self-pay | Admitting: Family Medicine

## 2023-03-23 ENCOUNTER — Telehealth (INDEPENDENT_AMBULATORY_CARE_PROVIDER_SITE_OTHER): Payer: BC Managed Care – PPO | Admitting: Family Medicine

## 2023-03-23 ENCOUNTER — Encounter: Payer: Self-pay | Admitting: Family Medicine

## 2023-03-23 VITALS — Ht 61.0 in | Wt 214.0 lb

## 2023-03-23 DIAGNOSIS — U071 COVID-19: Secondary | ICD-10-CM

## 2023-03-23 MED ORDER — GUAIFENESIN-CODEINE 100-10 MG/5ML PO SYRP: 10.0000 mL | ORAL_SOLUTION | Freq: Three times a day (TID) | ORAL | 0 refills | Status: DC | PRN

## 2023-03-23 MED ORDER — ALBUTEROL SULFATE HFA 108 (90 BASE) MCG/ACT IN AERS: 2.0000 | INHALATION_SPRAY | Freq: Four times a day (QID) | RESPIRATORY_TRACT | 2 refills | Status: DC | PRN

## 2023-03-23 NOTE — Progress Notes (Signed)
   Virtual Visit via Video   I connected with patient on 03/23/23 at  2:20 PM EDT by a video enabled telemedicine application and verified that I am speaking with the correct person using two identifiers.  Location patient: Home Location provider: [facility] Persons participating in the virtual visit: Patient, Provider, CMA Sheryle Hail C)  I discussed the limitations of evaluation and management by telemedicine and the availability of in person appointments. The patient expressed understanding and agreed to proceed.  Subjective:   HPI:   COVID- pt tested + on 8/30 after husband had it.  Pt reports cough is 'really really bad at night'.  Taking Robitussin and Nyquil w/o relief.  Taking Aleve for the HA.  No longer having body aches.  Denies SOB.  ROS:   See pertinent positives and negatives per HPI.  Patient Active Problem List   Diagnosis Date Noted   Hyperlipidemia 05/31/2022   Paresthesia and pain of both upper extremities 01/11/2021   Change in bowel habit 09/28/2020   Constipation 09/28/2020   Flatulence, eructation and gas pain 09/28/2020   HTN (hypertension) 09/06/2019   Pneumonia due to COVID-19 virus 08/01/2019   Multiple sclerosis (HCC) 05/11/2017   Morbid obesity (HCC) 06/05/2014   Leiomyoma 01/27/2014   Routine general medical examination at a health care facility 02/02/2012   THYROMEGALY 07/26/2010   SHOULDER, PAIN 01/06/2009    Social History   Tobacco Use   Smoking status: Former    Current packs/day: 0.00    Types: Cigarettes    Quit date: 1999    Years since quitting: 25.6   Smokeless tobacco: Never  Substance Use Topics   Alcohol use: Yes    Comment: occasional wine    Current Outpatient Medications:    cholecalciferol (VITAMIN D3) 25 MCG (1000 UNIT) tablet, Take 1,000 Units by mouth daily., Disp: , Rfl:    hydrOXYzine (ATARAX) 25 MG tablet, Take 1 tablet (25 mg total) by mouth every 6 (six) hours., Disp: 120 tablet, Rfl: 1    levocetirizine (XYZAL) 5 MG tablet, TAKE 1 TABLET(5 MG) BY MOUTH EVERY EVENING, Disp: 90 tablet, Rfl: 0   metoprolol tartrate (LOPRESSOR) 25 MG tablet, Take 1 tablet (25 mg total) by mouth daily., Disp: 90 tablet, Rfl: 1   Multiple Vitamin (MULTIVITAMIN WITH MINERALS) TABS tablet, Take 1 tablet by mouth daily. Woman's 50+, Disp: , Rfl:    zinc gluconate 50 MG tablet, Take 50 mg by mouth daily., Disp: , Rfl:   Allergies  Allergen Reactions   Gadolinium Other (See Comments)    Hypertension      Objective:   Ht 5\' 1"  (1.549 m)   Wt 214 lb (97.1 kg)   LMP 10/17/2011   BMI 40.43 kg/m  AAOx3, NAD NCAT, EOMI No obvious CN deficits Coloring WNL Pt is able to speak clearly, coherently without shortness of breath or increased work of breathing.  Thought process is linear.  Mood is appropriate.   Assessment and Plan:   COVID- new.  Pt is outside the window for Paxlovid.  Her biggest complaint at this time is cough- particularly at night.  Will start codeine cough syrup and albuterol to use as needed.  Reviewed supportive care and red flags that should prompt return.  Pt expressed understanding and is in agreement w/ plan.    Neena Rhymes, MD 03/23/2023

## 2023-04-05 ENCOUNTER — Other Ambulatory Visit: Payer: Self-pay | Admitting: Family Medicine

## 2023-04-05 DIAGNOSIS — L299 Pruritus, unspecified: Secondary | ICD-10-CM

## 2023-05-02 DIAGNOSIS — Z1211 Encounter for screening for malignant neoplasm of colon: Secondary | ICD-10-CM | POA: Diagnosis not present

## 2023-05-02 DIAGNOSIS — G35 Multiple sclerosis: Secondary | ICD-10-CM | POA: Diagnosis not present

## 2023-05-02 DIAGNOSIS — I10 Essential (primary) hypertension: Secondary | ICD-10-CM | POA: Diagnosis not present

## 2023-06-29 DIAGNOSIS — Z1212 Encounter for screening for malignant neoplasm of rectum: Secondary | ICD-10-CM | POA: Diagnosis not present

## 2023-06-29 DIAGNOSIS — Z1211 Encounter for screening for malignant neoplasm of colon: Secondary | ICD-10-CM | POA: Diagnosis not present

## 2023-06-30 LAB — COLOGUARD: Cologuard: NEGATIVE

## 2023-07-05 ENCOUNTER — Encounter: Payer: Self-pay | Admitting: Neurology

## 2023-07-05 ENCOUNTER — Encounter: Payer: Self-pay | Admitting: Family Medicine

## 2023-07-10 ENCOUNTER — Telehealth: Payer: Self-pay

## 2023-07-10 NOTE — Telephone Encounter (Signed)
Pt reports she has already been notified

## 2023-07-10 NOTE — Telephone Encounter (Signed)
-----   Message from Neena Rhymes sent at 07/10/2023  1:44 PM EST ----- Normal cologuard- great news!

## 2023-07-17 ENCOUNTER — Ambulatory Visit (INDEPENDENT_AMBULATORY_CARE_PROVIDER_SITE_OTHER): Payer: BC Managed Care – PPO | Admitting: Family Medicine

## 2023-07-17 ENCOUNTER — Encounter: Payer: Self-pay | Admitting: Family Medicine

## 2023-07-17 VITALS — BP 136/78 | HR 72 | Temp 98.0°F | Wt 222.2 lb

## 2023-07-17 DIAGNOSIS — B9689 Other specified bacterial agents as the cause of diseases classified elsewhere: Secondary | ICD-10-CM

## 2023-07-17 DIAGNOSIS — J329 Chronic sinusitis, unspecified: Secondary | ICD-10-CM

## 2023-07-17 MED ORDER — DOXYCYCLINE HYCLATE 100 MG PO TABS: 100.0000 mg | ORAL_TABLET | Freq: Two times a day (BID) | ORAL | 0 refills | Status: DC

## 2023-07-17 NOTE — Progress Notes (Signed)
   Subjective:    Patient ID: Gabriela Stephens, female    DOB: 1961/07/03, 62 y.o.   MRN: [id number]  HPI Sinus pressure- pt reports increased sinus pain/pressure for ~4-5 weeks.  Pt reports she did some research and finds that patients w/ MS have increased sinus issues and that all of her previous MRIs mention sinus congestion/inflammation.  Pt reports she is often unable to breathe out of her nose.  + frontal and maxillary pain/pressure.  No fevers.  No tooth pain.  Pt reports feeling very weak.  Taking xyzal daily, using OTC Mucinex.  Having dark green nasal mucous   Review of Systems For ROS see HPI     Objective:   Physical Exam Vitals reviewed.  Constitutional:      General: She is not in acute distress.    Appearance: Normal appearance. She is well-developed. She is not ill-appearing.  HENT:     Head: Normocephalic and atraumatic.     Right Ear: Tympanic membrane and ear canal normal.     Left Ear: Tympanic membrane and ear canal normal.     Nose: Mucosal edema and congestion present. No rhinorrhea.     Right Sinus: Frontal sinus tenderness present. No maxillary sinus tenderness.     Left Sinus: Frontal sinus tenderness present. No maxillary sinus tenderness.     Mouth/Throat:     Pharynx: Uvula midline. Posterior oropharyngeal erythema present. No oropharyngeal exudate.  Eyes:     Conjunctiva/sclera: Conjunctivae normal.     Pupils: Pupils are equal, round, and reactive to light.  Cardiovascular:     Rate and Rhythm: Normal rate and regular rhythm.     Heart sounds: Normal heart sounds.  Pulmonary:     Effort: Pulmonary effort is normal. No respiratory distress.     Breath sounds: Normal breath sounds. No wheezing.  Musculoskeletal:     Cervical back: Normal range of motion and neck supple.  Lymphadenopathy:     Cervical: No cervical adenopathy.  Skin:    General: Skin is warm and dry.  Neurological:     General: No focal deficit present.     Mental Status: She  is alert and oriented to person, place, and time.     Cranial Nerves: No cranial nerve deficit.     Motor: No weakness.     Coordination: Coordination normal.  Psychiatric:        Mood and Affect: Mood normal.        Behavior: Behavior normal.        Thought Content: Thought content normal.           Assessment & Plan:  Frontal sinusitis- new.  Pt's sxs and PE consistent w/ current infxn.  Start Doxycycline BID to treat current infxn.  Reviewed supportive care and red flags that should prompt return.  Pt expressed understanding and is in agreement w/ plan.   Recurrent sinusitis- new.  Given pt's recurrent sinus issues as seen on MRI and her underlying MS- and the correlation between the 2- will refer to ENT for complete evaluation and tx.  Pt expressed understanding and is in agreement w/ plan.

## 2023-07-17 NOTE — Patient Instructions (Addendum)
Follow up as needed or as scheduled START the Doxycycline twice daily- take w/ food Drink LOTS of fluids We'll call you to schedule your ENT appt Call with any questions or concerns Hang in there!! Happy New Year!!!

## 2023-07-21 ENCOUNTER — Other Ambulatory Visit: Payer: Self-pay | Admitting: Family Medicine

## 2023-07-21 DIAGNOSIS — L299 Pruritus, unspecified: Secondary | ICD-10-CM

## 2023-07-31 ENCOUNTER — Ambulatory Visit (INDEPENDENT_AMBULATORY_CARE_PROVIDER_SITE_OTHER): Payer: BC Managed Care – PPO | Admitting: Family Medicine

## 2023-07-31 ENCOUNTER — Encounter: Payer: Self-pay | Admitting: Family Medicine

## 2023-07-31 VITALS — BP 130/72 | HR 79 | Temp 98.7°F | Ht 61.0 in | Wt 219.0 lb

## 2023-07-31 DIAGNOSIS — Z1159 Encounter for screening for other viral diseases: Secondary | ICD-10-CM | POA: Diagnosis not present

## 2023-07-31 DIAGNOSIS — Z0000 Encounter for general adult medical examination without abnormal findings: Secondary | ICD-10-CM

## 2023-07-31 LAB — HEMOGLOBIN A1C: Hgb A1c MFr Bld: 6 % (ref 4.6–6.5)

## 2023-07-31 LAB — CBC WITH DIFFERENTIAL/PLATELET
Basophils Absolute: 0 10*3/uL (ref 0.0–0.1)
Basophils Relative: 0.7 % (ref 0.0–3.0)
Eosinophils Absolute: 0.1 10*3/uL (ref 0.0–0.7)
Eosinophils Relative: 2.1 % (ref 0.0–5.0)
HCT: 41.6 % (ref 36.0–46.0)
Hemoglobin: 13.4 g/dL (ref 12.0–15.0)
Lymphocytes Relative: 42.8 % (ref 12.0–46.0)
Lymphs Abs: 2.9 10*3/uL (ref 0.7–4.0)
MCHC: 32.3 g/dL (ref 30.0–36.0)
MCV: 92.7 fL (ref 78.0–100.0)
Monocytes Absolute: 0.4 10*3/uL (ref 0.1–1.0)
Monocytes Relative: 5.6 % (ref 3.0–12.0)
Neutro Abs: 3.3 10*3/uL (ref 1.4–7.7)
Neutrophils Relative %: 48.8 % (ref 43.0–77.0)
Platelets: 449 10*3/uL — ABNORMAL HIGH (ref 150.0–400.0)
RBC: 4.49 Mil/uL (ref 3.87–5.11)
RDW: 14.3 % (ref 11.5–15.5)
WBC: 6.7 10*3/uL (ref 4.0–10.5)

## 2023-07-31 LAB — TSH: TSH: 0.55 u[IU]/mL (ref 0.35–5.50)

## 2023-07-31 LAB — HEPATIC FUNCTION PANEL
ALT: 16 U/L (ref 0–35)
AST: 25 U/L (ref 0–37)
Albumin: 4.9 g/dL (ref 3.5–5.2)
Alkaline Phosphatase: 108 U/L (ref 39–117)
Bilirubin, Direct: 0.1 mg/dL (ref 0.0–0.3)
Total Bilirubin: 0.3 mg/dL (ref 0.2–1.2)
Total Protein: 8.3 g/dL (ref 6.0–8.3)

## 2023-07-31 LAB — BASIC METABOLIC PANEL
BUN: 13 mg/dL (ref 6–23)
CO2: 28 meq/L (ref 19–32)
Calcium: 10.6 mg/dL — ABNORMAL HIGH (ref 8.4–10.5)
Chloride: 104 meq/L (ref 96–112)
Creatinine, Ser: 0.76 mg/dL (ref 0.40–1.20)
GFR: 84.13 mL/min (ref >60.00)
Glucose, Bld: 91 mg/dL (ref 70–99)
Potassium: 4.6 meq/L (ref 3.5–5.1)
Sodium: 141 meq/L (ref 135–145)

## 2023-07-31 LAB — LIPID PANEL
Cholesterol: 235 mg/dL — ABNORMAL HIGH (ref 0–200)
HDL: 58.5 mg/dL (ref >39.00)
LDL Cholesterol: 165 mg/dL — ABNORMAL HIGH (ref 0–99)
NonHDL: 176.06
Total CHOL/HDL Ratio: 4
Triglycerides: 54 mg/dL (ref 0.0–149.0)
VLDL: 10.8 mg/dL (ref 0.0–40.0)

## 2023-07-31 LAB — VITAMIN D 25 HYDROXY (VIT D DEFICIENCY, FRACTURES): VITD: 48.19 ng/mL (ref 30.00–100.00)

## 2023-07-31 NOTE — Assessment & Plan Note (Signed)
 Pt's PE WNL w/ exception of BMI.  UTD on pap, mammo, cologuard.  Declines Tdap today.  Check labs.  Anticipatory guidance provided.

## 2023-07-31 NOTE — Progress Notes (Signed)
   Subjective:    Patient ID: Gabriela Stephens , female    DOB: [birth year], 63 y.o.   MRN: [id number]  HPI CPE- UTD on mammo, cologuard, pap.  Declines Tdap- will plan for next time  Patient Care Team    Relationship Specialty Notifications Start End  Mahlon Comer BRAVO, MD PCP - General   06/30/10   Skeet Juliene SAUNDERS, DO Consulting Physician Neurology  04/15/20   Ginette Shasta NOVAK, NP Nurse Practitioner Nurse Practitioner  04/15/20      Health Maintenance  Topic Date Due   Hepatitis C Screening  Never done   DTaP/Tdap/Td (1 - Tdap) Never done   Zoster Vaccines- Shingrix (1 of 2) Never done   INFLUENZA VACCINE  02/16/2023   COVID-19 Vaccine (5 - 2024-25 season) 03/19/2023   MAMMOGRAM  01/10/2025   Fecal DNA (Cologuard)  06/29/2026   Cervical Cancer Screening (HPV/Pap Cotest)  01/07/2027   HIV Screening  Completed   HPV VACCINES  Aged Out   Colonoscopy  Discontinued      Review of Systems Patient reports no vision/ hearing changes, adenopathy,fever, weight change,  persistant/recurrent hoarseness , swallowing issues, chest pain, palpitations, edema, persistant/recurrent cough, hemoptysis, dyspnea (rest/exertional/paroxysmal nocturnal), gastrointestinal bleeding (melena, rectal bleeding), abdominal pain, significant heartburn, bowel changes, GU symptoms (dysuria, hematuria, incontinence), Gyn symptoms (abnormal  bleeding, pain),  syncope, focal weakness, memory loss, numbness & tingling, skin/hair/nail changes, abnormal bruising or bleeding, anxiety, or depression.     Objective:   Physical Exam General Appearance:    Alert, cooperative, no distress, appears stated age, obese  Head:    Normocephalic, without obvious abnormality, atraumatic  Eyes:    PERRL, conjunctiva/corneas clear, EOM's intact both eyes  Ears:    Normal TM's and external ear canals, both ears  Nose:   Nares normal, septum midline, mucosa normal, no drainage    or sinus tenderness  Throat:   Lips, mucosa, and  tongue normal; teeth and gums normal  Neck:   Supple, symmetrical, trachea midline, no adenopathy;    Thyroid : no enlargement/tenderness/nodules  Back:     Symmetric, no curvature, ROM normal, no CVA tenderness  Lungs:     Clear to auscultation bilaterally, respirations unlabored  Chest Wall:    No tenderness or deformity   Heart:    Regular rate and rhythm, S1 and S2 normal, no murmur, rub   or gallop  Breast Exam:    Deferred to mammo  Abdomen:     Soft, non-tender, bowel sounds active all four quadrants,    no masses, no organomegaly  Genitalia:    Deferred  Rectal:    Extremities:   Extremities normal, atraumatic, no cyanosis or edema  Pulses:   2+ and symmetric all extremities  Skin:   Skin color, texture, turgor normal, no rashes or lesions  Lymph nodes:   Cervical, supraclavicular, and axillary nodes normal  Neurologic:   CNII-XII intact, normal strength, sensation and reflexes    throughout          Assessment & Plan:

## 2023-07-31 NOTE — Assessment & Plan Note (Signed)
 Ongoing issue.  Pt has resumed going to the gym.  Applauded her efforts.  Check labs to risk stratify.  Will follow.

## 2023-07-31 NOTE — Telephone Encounter (Signed)
-----   Message from Comer Greet sent at 07/31/2023  4:01 PM EST ----- Your total cholesterol and LDL (bad cholesterol) have both increased.  This places you at a higher risk for heart attack or stroke.  Our goal is to get that LDL down around 100.  Based on this, we need to start Crestor  10mg  nightly (#90, 1 refill) and we'll repeat your liver enzymes at a lab only visit in 6 weeks to make sure you are metabolizing the medication correctly.  (LFTs, dx hyperlipidemia)  Remainder of labs look great!

## 2023-07-31 NOTE — Patient Instructions (Signed)
Follow up in 6 months to recheck BP and cholesterol °We'll notify you of your lab results and make any changes if needed °Continue to work on healthy diet and regular exercise- you look great!!! °Call with any questions or concerns °Stay Safe!  Stay Healthy! °Happy New Year!! °

## 2023-08-01 ENCOUNTER — Other Ambulatory Visit: Payer: Self-pay

## 2023-08-01 DIAGNOSIS — E785 Hyperlipidemia, unspecified: Secondary | ICD-10-CM

## 2023-08-01 LAB — HEPATITIS C ANTIBODY: Hepatitis C Ab: NONREACTIVE

## 2023-08-04 ENCOUNTER — Encounter (INDEPENDENT_AMBULATORY_CARE_PROVIDER_SITE_OTHER): Payer: Self-pay | Admitting: Otolaryngology

## 2023-08-11 ENCOUNTER — Other Ambulatory Visit: Payer: Self-pay | Admitting: Family Medicine

## 2023-08-17 NOTE — Progress Notes (Signed)
NEUROLOGY FOLLOW UP OFFICE NOTE  Gabriela Stephens [id number]  Assessment/Plan:   Multiple sclerosis- never on DMT due to concerns of potential side effects.  Clinically doing well.   D3 4000 IU daily Follow up one year     Subjective:  Gabriela Stephens is a 63 year old right-handed female who follows up for multiple sclerosis.   UPDATE: DMT:  None Other:  D3 4000 IU daily  07/31/2023 LABS:  Vid D 48.19  In December, she had sinus symptoms - nasal congestions and head pressure, generalized fatigue and body aches.  Took doxycycline and symptoms resolved.  Doing okay now.     Vision:  No issues Motor:  No issue Sensory:  No issue Pain: No issues Gait:  good.  Slightly "wobbly" but no falls. Bowel/bladder:  No issue Fatigue:  no Cognition:  good Mood:  good   HISTORY:  She was diagnosed with multiple sclerosis in 1993, when she had her first flare, presenting as right sided numbness and weakness.  MRI of brain and cervical spine showed lesions.  She may have had a lumbar puncture.  She was treated with IV steroids in the hospital.  Symptoms lasted about 3 months.  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  Symptoms lasted 2 to 3 weeks.  She was again treated with steroids.  She had a second relapse in 1998 in which she had a recurrence of left optic neuritis and numbness and tingling of the extremities.  She was again treated with steroids.  It was recommended that she start Avonex but she deferred.  She was never on disease modifying therapy.  Over the years, she would have occasional periods of lethargy in which she would remain in bed for a few days.  Otherwise, she has not had any other chronic symptoms.  She denies bladder dysfunction, chronic pain or memory deficits.   In September 2018, she began experiencing some numbness, aches and heaviness in her right arm.  She also has been experiencing numbness and tingling  in the hands and feet.  Her balance is also off.  These are symptoms concerning for possible relapse.  Symptoms are unchanged and have not progressed or improved.   I saw patient in 2018.  She had deferred starting a DMT at that time due to concerns of potential side effects.  She was lost to follow up.   In 2022, she began experiencing intermittent burning sensation and itching in her bilateral proximal posterior arms and left lower abdomen.  No rash.  Went to urgent care on 11/14/2020 where she was given Benadryl and Zyrtec for presumed dermatitis.  Symptoms persisted and she went to the ED on 11/27/2020 where she was simply prescribed a 6 day 21 tablet prednisone taper and referred to neurology.    Imaging: 07/22/2021 MRI BRAIN & C-SPINE: stable compared to prior imaging from 06/05/2018 06/05/2018 MRI BRAIN WO:  Stable white matter hyperintensities compatible with history of demyelination and multiple sclerosis. No new lesion identified. 06/05/2018 MRI C-SPINE WO:  1. 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 No new cervical cord lesion identified.  2. Stable mild cervical spondylosis at the C4-5 and C5-6 levels. No high-grade foraminal or canal stenosis. 06/11/2017 MRI BRAIN W WO:  multiple T2 FLAIR  hyperintense lesions in the cerebral/periventricular and pericallosal white matter without enhancement, as well as single lesion in the right cerebellum.  Incidentally,  there is small left paramedian parietal developmental venous anomaly and extensive paranasal sinus disease.   06/11/2017 MRI C-SPINE W NW:[id number] left posterolateral cord lesion at C6 level.   Family history:  Paternal second cousin has MS.  Sister has RA.  Father had ALS.  Mother had Alzheimer's disease.   PAST MEDICAL HISTORY: Past Medical History:  Diagnosis Date   Anemia    during pregancy   COVID 07/2020   hospitalized for 5 days   Hypertension    Multiple sclerosis ([facility]CC) 03/1992     MEDICATIONS: Current Outpatient Medications on File Prior to Visit  Medication Sig Dispense Refill   albuterol (VENTOLIN HFA) 108 (90 Base) MCG/ACT inhaler Inhale 2 puffs into the lungs every 6 (six) hours as needed for wheezing or shortness of breath. 8 g 2   cholecalciferol (VITAMIN D3) 25 MCG (1000 UNIT) tablet Take 1,000 Units by mouth daily.     doxycycline (VIBRA-TABS) 100 MG tablet Take 1 tablet (100 mg total) by mouth 2 (two) times daily. 20 tablet 0   guaiFENesin-codeine (ROBITUSSIN AC) 100-10 MG/5ML syrup Take 10 mLs by mouth 3 (three) times daily as needed for cough. 120 mL 0   hydrOXYzine (ATARAX) 25 MG tablet Take 1 tablet (25 mg total) by mouth every 6 (six) hours. 120 tablet 1   levocetirizine (XYZAL) 5 MG tablet TAKE 1 TABLET(5 MG) BY MOUTH EVERY EVENING 90 tablet 0   metoprolol tartrate (LOPRESSOR) 25 MG tablet Take 1 tablet (25 mg total) by mouth daily. 90 tablet 1   Multiple Vitamin (MULTIVITAMIN WITH MINERALS) TABS tablet Take 1 tablet by mouth daily. Woman's 50+     zinc gluconate 50 MG tablet Take 50 mg by mouth daily.     No current facility-administered medications on file prior to visit.    ALLERGIES: Allergies  Allergen Reactions   Gadolinium Other (See Comments)    Hypertension      FAMILY HISTORY: Family History  Problem Relation Age of Onset   Alzheimer's disease Mother    ALS Father       Objective:  Blood pressure 132/70, pulse 83, height 5\' 1"  (1.549 m), weight 213 lb (96.6 kg), last menstrual period 10/17/2011, SpO2 99%. General: No acute distress.  Patient appears well-groomed.   Head:  Normocephalic/atraumatic Eyes:  Fundi examined but not visualized Neck: supple, no paraspinal tenderness, full range of motion Heart:  Regular rate and rhythm Neurological Exam: alert and oriented.  Speech fluent and not dysarthric, language intact.  CN II-XII intact. Bulk and tone normal, muscle strength 5/5 throughout.  Sensation to pinprick and  vibration intact.  Deep tendon reflexes 1+ throughout.  Finger to nose testing intact.  Gait normal, Romberg negative.   Shon Millet, DO  CC: Neena Rhymes, MD

## 2023-08-18 ENCOUNTER — Ambulatory Visit (INDEPENDENT_AMBULATORY_CARE_PROVIDER_SITE_OTHER): Payer: BC Managed Care – PPO | Admitting: Neurology

## 2023-08-18 ENCOUNTER — Encounter: Payer: Self-pay | Admitting: Neurology

## 2023-08-18 VITALS — BP 132/70 | HR 83 | Ht 61.0 in | Wt 213.0 lb

## 2023-08-18 DIAGNOSIS — G35 Multiple sclerosis: Secondary | ICD-10-CM | POA: Diagnosis not present

## 2023-08-18 NOTE — Patient Instructions (Signed)
D3 4000 I U daily

## 2023-09-04 ENCOUNTER — Encounter: Payer: Self-pay | Admitting: Family Medicine

## 2023-09-05 MED ORDER — ROSUVASTATIN CALCIUM 10 MG PO TABS: 10.0000 mg | ORAL_TABLET | Freq: Every day | ORAL | 1 refills | Status: DC

## 2023-09-05 NOTE — Telephone Encounter (Signed)
Patient has updated message for Lab concerns

## 2023-09-15 ENCOUNTER — Other Ambulatory Visit: Payer: BC Managed Care – PPO

## 2023-09-18 ENCOUNTER — Encounter (INDEPENDENT_AMBULATORY_CARE_PROVIDER_SITE_OTHER): Payer: Self-pay | Admitting: Family Medicine

## 2023-09-18 DIAGNOSIS — F40243 Fear of flying: Secondary | ICD-10-CM | POA: Diagnosis not present

## 2023-09-18 MED ORDER — DIAZEPAM 2 MG PO TABS
2.0000 mg | ORAL_TABLET | Freq: Four times a day (QID) | ORAL | 0 refills | Status: DC | PRN
Start: 2023-09-18 — End: 2024-04-12

## 2023-09-18 NOTE — Telephone Encounter (Signed)
 [facility] VISIT   Patient agreed to [facility] visit and is aware that copayment and coinsurance may apply. Patient was treated using telemedicine according to accepted telemedicine protocols.  Subjective:   Patient complains of flight anxiety  Patient Active Problem List   Diagnosis Date Noted   Hyperlipidemia 05/31/2022   Paresthesia and pain of both upper extremities 01/11/2021   Change in bowel habit 09/28/2020   Constipation 09/28/2020   Flatulence, eructation and gas pain 09/28/2020   HTN (hypertension) 09/06/2019   Multiple sclerosis (HCC) 05/11/2017   Morbid obesity (HCC) 06/05/2014   Leiomyoma 01/27/2014   Routine general medical examination at a health care facility 02/02/2012   Goiter 07/26/2010   SHOULDER, PAIN 01/06/2009   Social History   Tobacco Use   Smoking status: Former    Current packs/day: 0.00    Types: Cigarettes    Quit date: 1999    Years since quitting: 26.1   Smokeless tobacco: Never  Substance Use Topics   Alcohol use: Yes    Comment: occasional wine    Current Outpatient Medications:    diazepam (VALIUM) 2 MG tablet, Take 1 tablet (2 mg total) by mouth every 6 (six) hours as needed for anxiety., Disp: 15 tablet, Rfl: 0   cholecalciferol (VITAMIN D3) 25 MCG (1000 UNIT) tablet, Take 1,000 Units by mouth daily., Disp: , Rfl:    hydrOXYzine (ATARAX) 25 MG tablet, Take 1 tablet (25 mg total) by mouth every 6 (six) hours., Disp: 120 tablet, Rfl: 1   levocetirizine (XYZAL) 5 MG tablet, TAKE 1 TABLET(5 MG) BY MOUTH EVERY EVENING, Disp: 90 tablet, Rfl: 0   metoprolol tartrate (LOPRESSOR) 25 MG tablet, Take 1 tablet (25 mg total) by mouth daily., Disp: 90 tablet, Rfl: 1   Multiple Vitamin (MULTIVITAMIN WITH MINERALS) TABS tablet, Take 1 tablet by mouth daily. Woman's 50+, Disp: , Rfl:    rosuvastatin (CRESTOR) 10 MG tablet, Take 1 tablet (10 mg total) by mouth daily., Disp: 90 tablet, Rfl: 1   zinc gluconate 50 MG tablet, Take 50 mg by mouth daily., Disp: ,  Rfl:   Allergies  Allergen Reactions   Gadolinium Other (See Comments)    Hypertension      Assessment and Plan:   Diagnosis: Anxiety w/ flying. Please see myChart communication and orders below.   No orders of the defined types were placed in this encounter.  Meds ordered this encounter  Medications   diazepam (VALIUM) 2 MG tablet    Sig: Take 1 tablet (2 mg total) by mouth every 6 (six) hours as needed for anxiety.    Dispense:  15 tablet    Refill:  0    Neena Rhymes, MD 09/18/2023  A total of 6 minutes were spent by me to personally review the patient-generated inquiry, review patient records and data pertinent to assessment of the patient's problem, develop a management plan including generation of prescriptions and/or orders, and on subsequent communication with the patient through secure the MyChart portal service.   There is no separately reported E/M service related to this service in the past 7 days nor does the patient have an upcoming soonest available appointment for this issue. This work was completed in less than 7 days.   The patient consented to this service today (see patient agreement prior to ongoing communication). Patient counseled regarding the need for in-person exam for certain conditions and was advised to call the office if any changing or worsening symptoms occur.   The codes to  be used for the E/M service are: [x]   99421 for 5-10 minutes of time spent on the inquiry. []   [id number] for 11-20 minutes. []   [id number] for 21+ minutes.

## 2023-09-23 IMAGING — MR MR CERVICAL SPINE WO/W CM
4 of 8 series · 18 of 48 positions shown · IV contrast (cc GAD)
Comparison: Cervical spine MRI 06/05/2018

CLINICAL DATA: Multiple sclerosis follow-up

EXAM:
MRI CERVICAL SPINE WITHOUT AND WITH CONTRAST
TECHNIQUE: Multiplanar and multiecho pulse sequences of the cervical spine, to
include the craniocervical junction and cervicothoracic junction,
were obtained without and with intravenous contrast.
CONTRAST:  9mL GADAVIST GADOBUTROL 1 MMOL/ML IV SOLN

[Series 12: T2 · sagittal · 3.0mm · 0.43mm/px · 3 of 16 slices shown (1 of 2)]
[im 1/16]
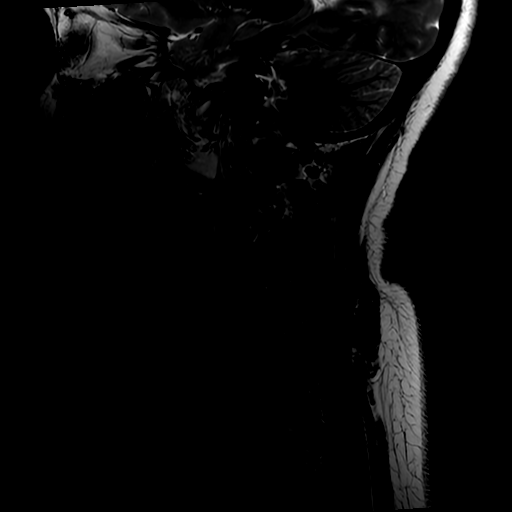
[im 8/16]
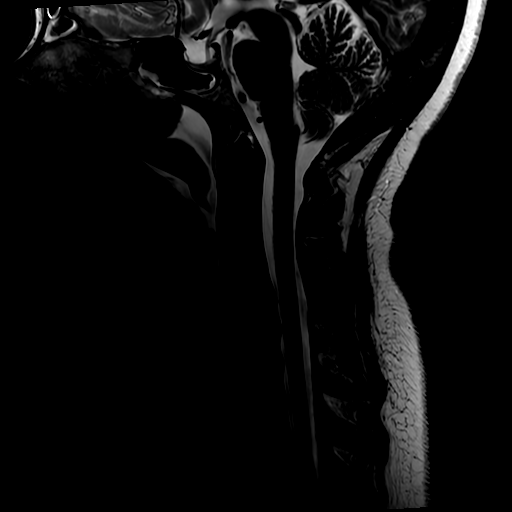
[im 16/16]
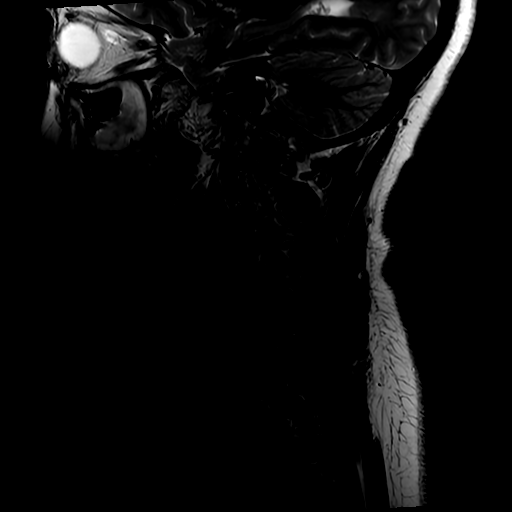

[Series 16: T2 · axial · 3.0mm · 0.35mm/px · z∈[-215,-117]mm · 6 of 33 slices shown (2 of 2)]
[im 1/33]
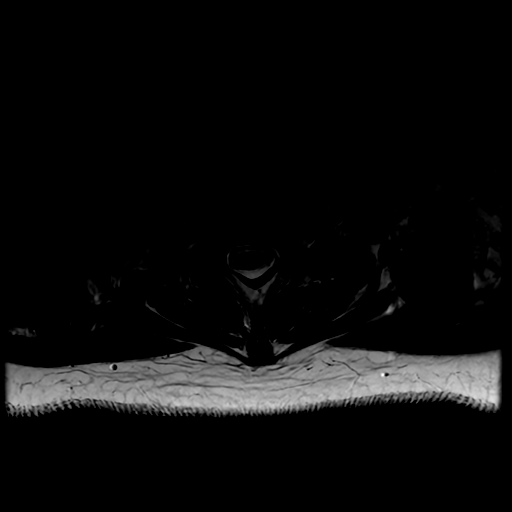
[im 7/33]
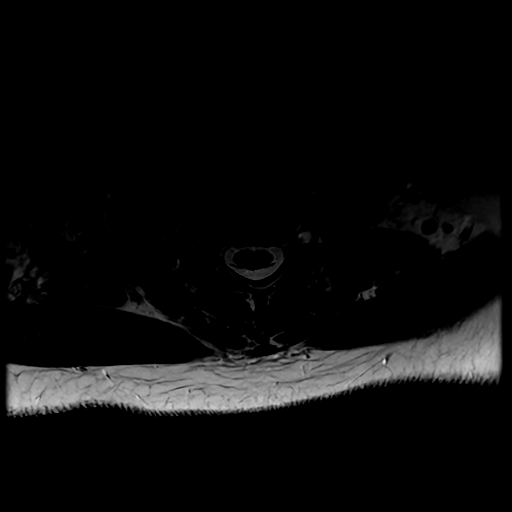
[im 13/33]
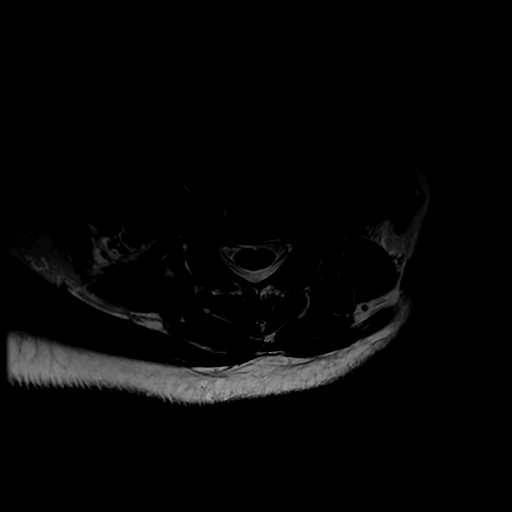
[im 20/33]
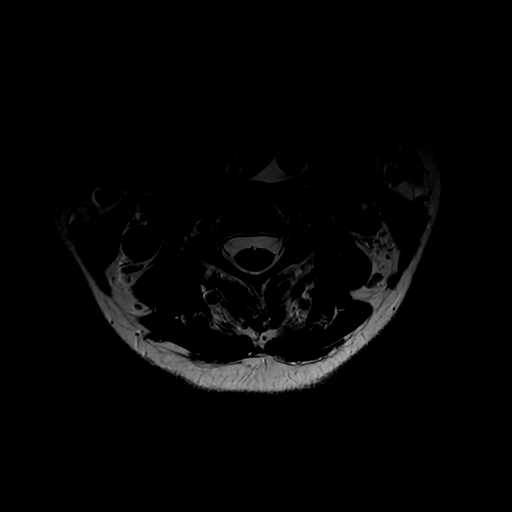
[im 26/33]
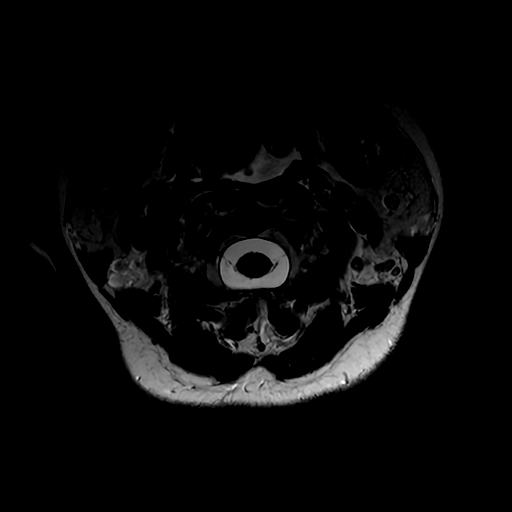
[im 33/33]
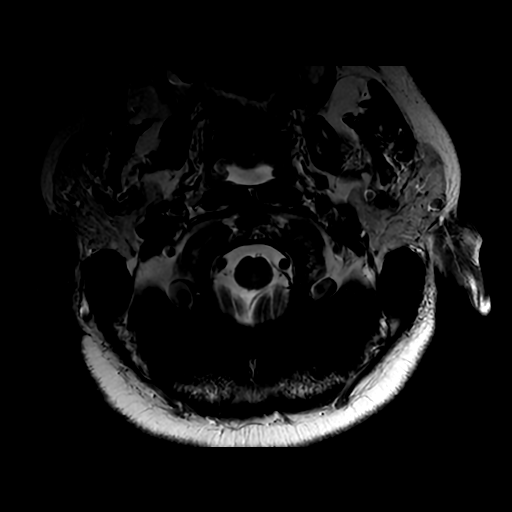

[Series 17: T1 · axial · non-contrast · 3.0mm · 0.35mm/px · z∈[-215,-117]mm · 6 of 33 slices shown]
[im 1/33]
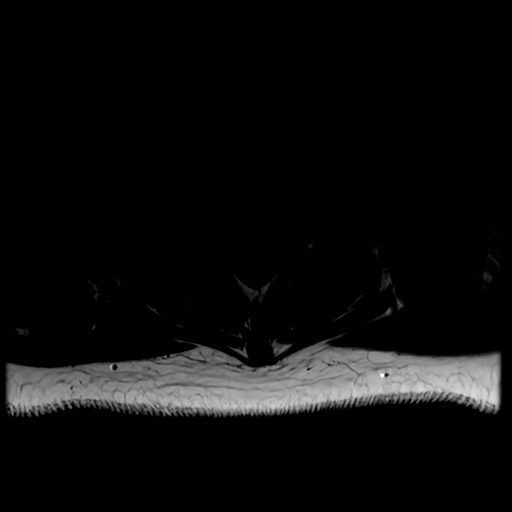
[im 7/33]
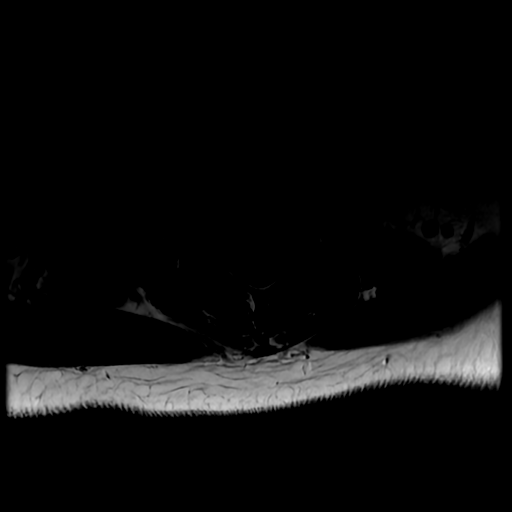
[im 13/33]
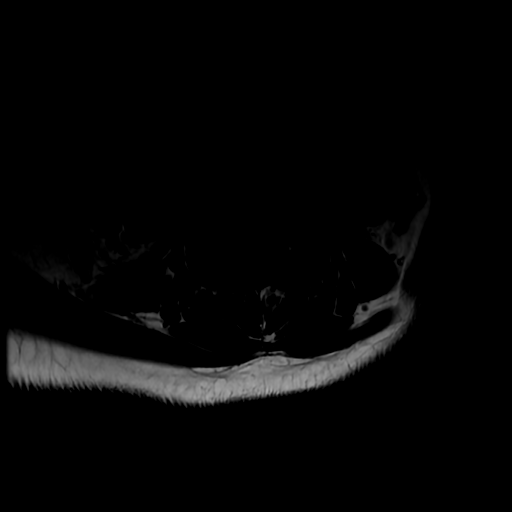
[im 20/33]
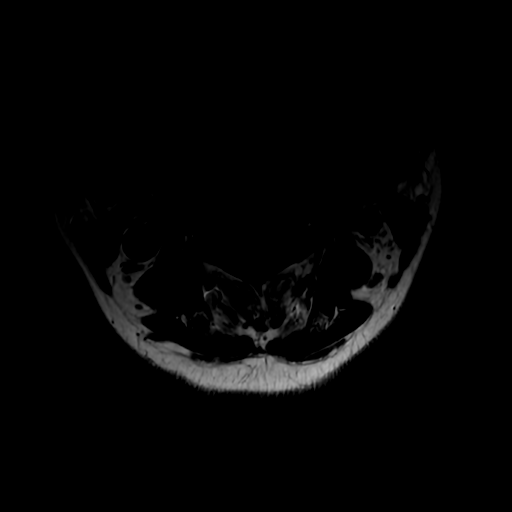
[im 26/33]
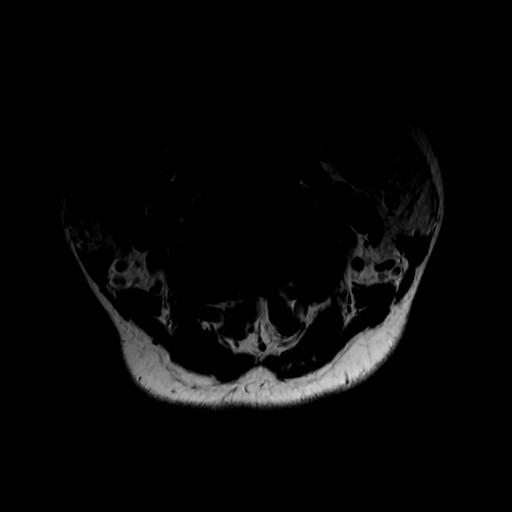
[im 33/33]
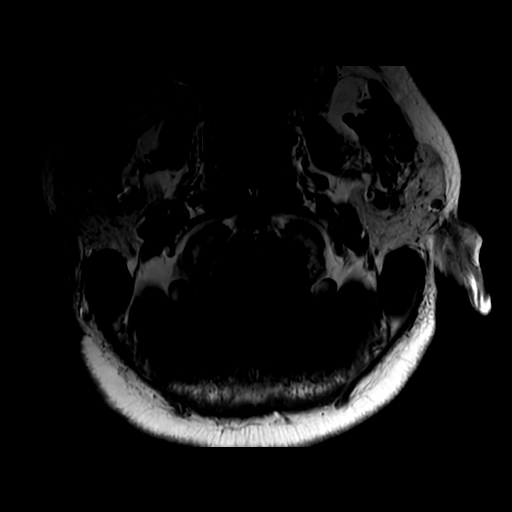

[Series 18: T1 fat-sat post-contrast · sagittal · 3.0mm · 0.43mm/px · 3 of 16 slices shown]
[im 1/16]
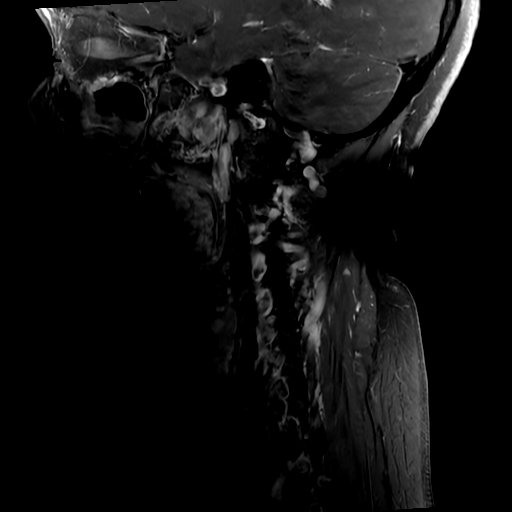
[im 8/16]
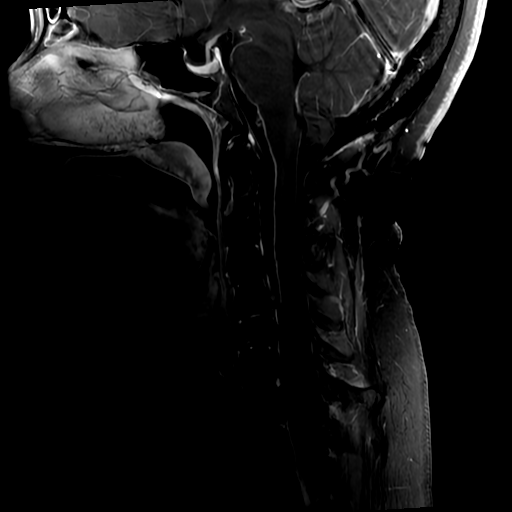
[im 16/16]
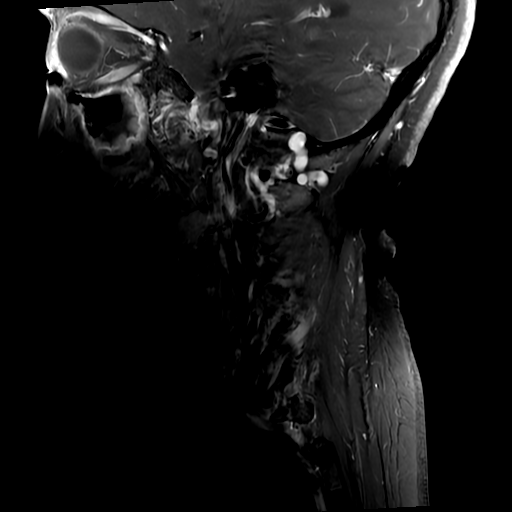

[18 of 48 positions shown; findings below may reference images not displayed]

FINDINGS: Alignment: Is straightening of the normal cervical spine lordosis
with slight focal kyphosis centered at C5, unchanged. There is no
antero or retrolisthesis.

Vertebrae: Vertebral body heights are preserved. Marrow signal is
normal. There is no suspicious marrow signal abnormality or abnormal
marrow enhancement.

Cord: There is rounded cord signal abnormality dorsally at C2
(15-20). This region was not entirely included on the field-of-view
on the axial images on the prior study, but in retrospect was likely
present on the sagittal STIR sequence. The additional lesion in the
left aspect of the cord at C6 is unchanged (15-78). There is no
abnormal cord enhancement to suggest active demyelination

Posterior Fossa, vertebral arteries, paraspinal tissues: The
posterior fossa is assessed on the separately dictated brain MRI.
The vertebral artery flow voids are present. Fluid is noted in the
airway related to intubation. There is a 1.3 cm right thyroid
nodule. No specific follow-up is required. The paraspinal soft
tissues are unremarkable.

Disc levels:

There is multilevel disc desiccation and narrowing, most advanced at
C5-C6, unchanged.

C2-C3: No significant spinal canal or neural foraminal stenosis.

C3-C4: No significant spinal canal or neural foraminal stenosis.

C4-C5: There is a mild posterior disc osteophyte complex without
significant spinal canal or neural foraminal stenosis.

C5-C6: Is a mild posterior disc osteophyte complex and mild
uncovertebral and facet arthropathy without significant spinal canal
or neural foraminal stenosis.

C6-C7: Mild uncovertebral and facet arthropathy without significant
spinal canal or neural foraminal stenosis.

C7-T1: No significant spinal canal or neural foraminal stenosis.
IMPRESSION: 1. Demyelinating lesion at C2, likely present on the prior study
from 0738 in retrospect.
2. Stable demyelinating lesion at C6.
3. No abnormal cord enhancement to suggest active demyelination.
4. Mild multilevel degenerative changes as above, not significantly
changed.

## 2023-09-28 ENCOUNTER — Other Ambulatory Visit: Payer: Self-pay | Admitting: Family Medicine

## 2023-10-13 ENCOUNTER — Other Ambulatory Visit: Payer: BC Managed Care – PPO

## 2023-10-20 ENCOUNTER — Other Ambulatory Visit (INDEPENDENT_AMBULATORY_CARE_PROVIDER_SITE_OTHER)

## 2023-10-20 ENCOUNTER — Other Ambulatory Visit

## 2023-10-20 ENCOUNTER — Encounter: Payer: Self-pay | Admitting: Family Medicine

## 2023-10-20 DIAGNOSIS — E785 Hyperlipidemia, unspecified: Secondary | ICD-10-CM

## 2023-10-20 LAB — LIPID PANEL
Cholesterol: 134 mg/dL (ref 0–200)
HDL: 44.1 mg/dL (ref >39.00)
LDL Cholesterol: 81 mg/dL (ref 0–99)
NonHDL: 89.67
Total CHOL/HDL Ratio: 3
Triglycerides: 43 mg/dL (ref 0.0–149.0)
VLDL: 8.6 mg/dL (ref 0.0–40.0)

## 2023-10-20 LAB — HEPATIC FUNCTION PANEL
ALT: 20 U/L (ref 0–35)
AST: 36 U/L (ref 0–37)
Albumin: 4.4 g/dL (ref 3.5–5.2)
Alkaline Phosphatase: 87 U/L (ref 39–117)
Bilirubin, Direct: 0.1 mg/dL (ref 0.0–0.3)
Total Bilirubin: 0.3 mg/dL (ref 0.2–1.2)
Total Protein: 7.4 g/dL (ref 6.0–8.3)

## 2023-10-20 NOTE — Telephone Encounter (Signed)
-----   Message from Neena Rhymes sent at 10/20/2023  2:59 PM EDT ----- Cholesterol looks AMAZING!!!  Continue the medication and keep up the good work!

## 2023-10-20 NOTE — Telephone Encounter (Signed)
 Lab results have been discussed.   Verbalized understanding? Yes  Are there any questions? no

## 2024-01-18 ENCOUNTER — Telehealth: Payer: Self-pay | Admitting: Family Medicine

## 2024-01-18 NOTE — Telephone Encounter (Signed)
 error

## 2024-01-22 DIAGNOSIS — Z01419 Encounter for gynecological examination (general) (routine) without abnormal findings: Secondary | ICD-10-CM | POA: Diagnosis not present

## 2024-01-22 DIAGNOSIS — Z1231 Encounter for screening mammogram for malignant neoplasm of breast: Secondary | ICD-10-CM | POA: Diagnosis not present

## 2024-01-22 DIAGNOSIS — Z6841 Body Mass Index (BMI) 40.0 and over, adult: Secondary | ICD-10-CM | POA: Diagnosis not present

## 2024-01-29 ENCOUNTER — Ambulatory Visit: Payer: BC Managed Care – PPO | Admitting: Family Medicine

## 2024-02-06 DIAGNOSIS — L814 Other melanin hyperpigmentation: Secondary | ICD-10-CM | POA: Diagnosis not present

## 2024-02-06 DIAGNOSIS — D225 Melanocytic nevi of trunk: Secondary | ICD-10-CM | POA: Diagnosis not present

## 2024-02-06 DIAGNOSIS — L2089 Other atopic dermatitis: Secondary | ICD-10-CM | POA: Diagnosis not present

## 2024-02-06 DIAGNOSIS — L821 Other seborrheic keratosis: Secondary | ICD-10-CM | POA: Diagnosis not present

## 2024-03-04 ENCOUNTER — Other Ambulatory Visit: Payer: Self-pay

## 2024-03-04 MED ORDER — ROSUVASTATIN CALCIUM 10 MG PO TABS: 10.0000 mg | ORAL_TABLET | Freq: Every day | ORAL | 1 refills | Status: AC

## 2024-03-06 ENCOUNTER — Ambulatory Visit: Admitting: Family Medicine

## 2024-04-01 ENCOUNTER — Other Ambulatory Visit: Payer: Self-pay | Admitting: Family Medicine

## 2024-04-12 ENCOUNTER — Ambulatory Visit (INDEPENDENT_AMBULATORY_CARE_PROVIDER_SITE_OTHER): Admitting: Family Medicine

## 2024-04-12 ENCOUNTER — Encounter: Payer: Self-pay | Admitting: Family Medicine

## 2024-04-12 VITALS — BP 108/70 | HR 68 | Temp 98.4°F | Ht 61.0 in | Wt 212.2 lb

## 2024-04-12 DIAGNOSIS — I10 Essential (primary) hypertension: Secondary | ICD-10-CM

## 2024-04-12 DIAGNOSIS — E785 Hyperlipidemia, unspecified: Secondary | ICD-10-CM

## 2024-04-12 NOTE — Assessment & Plan Note (Signed)
 Chronic problem.  On Metoprolol  25mg  daily w/ excellent control.  Currently asymptomatic.  No med changes at this time.

## 2024-04-12 NOTE — Progress Notes (Signed)
   Subjective:    Patient ID: Gabriela Stephens , female    DOB: [birth year], 63 y.o.   MRN: [id number]  HPI HTN- chronic problem, on Metoprolol  25mg  daily w/ excellent control.  No CP, SOB, HA's, visual changes, edema.  Hyperlipidemia- chronic problem.  On Crestor  10mg  daily.  No abd pain, N/V.  Obesity- down 7 lbs.  BMI 40.1  Exercising- doing 30 minute YouTube exercise routines, under-desk elliptical.   Review of Systems For ROS see HPI     Objective:   Physical Exam Vitals reviewed.  Constitutional:      General: She is not in acute distress.    Appearance: Normal appearance. She is well-developed. She is obese. She is not ill-appearing.  HENT:     Head: Normocephalic and atraumatic.  Eyes:     Conjunctiva/sclera: Conjunctivae normal.     Pupils: Pupils are equal, round, and reactive to light.  Neck:     Thyroid : No thyromegaly.  Cardiovascular:     Rate and Rhythm: Normal rate and regular rhythm.     Pulses: Normal pulses.     Heart sounds: Normal heart sounds. No murmur heard. Pulmonary:     Effort: Pulmonary effort is normal. No respiratory distress.     Breath sounds: Normal breath sounds.  Abdominal:     General: There is no distension.     Palpations: Abdomen is soft.     Tenderness: There is no abdominal tenderness.  Musculoskeletal:     Cervical back: Normal range of motion and neck supple.     Right lower leg: No edema.     Left lower leg: No edema.  Lymphadenopathy:     Cervical: No cervical adenopathy.  Skin:    General: Skin is warm and dry.  Neurological:     General: No focal deficit present.     Mental Status: She is alert and oriented to person, place, and time.  Psychiatric:        Mood and Affect: Mood normal.        Behavior: Behavior normal.        Thought Content: Thought content normal.           Assessment & Plan:

## 2024-04-12 NOTE — Assessment & Plan Note (Signed)
 Pt is down 7 lbs since last visit.  Is now exercising regularly.  Applauded her efforts.  Will continue to follow.

## 2024-04-12 NOTE — Assessment & Plan Note (Signed)
Chronic problem.  Currently on Crestor 10mg daily w/o difficulty.  Check labs.  Adjust meds prn  

## 2024-04-12 NOTE — Patient Instructions (Signed)
Schedule your complete physical in 6 months We'll notify you of your lab results and make any changes if needed Keep up the good work on healthy diet and regular exercise- you're doing great!!! Call with any questions or concerns Stay Safe!  Stay Healthy! Happy Fall!!! 

## 2024-04-13 LAB — HEPATIC FUNCTION PANEL
AG Ratio: 1.7 (calc) (ref 1.0–2.5)
ALT: 16 U/L (ref 6–29)
AST: 23 U/L (ref 10–35)
Albumin: 4.6 g/dL (ref 3.6–5.1)
Alkaline phosphatase (APISO): 100 U/L (ref 37–153)
Bilirubin, Direct: 0 mg/dL (ref 0.0–0.2)
Globulin: 2.7 g/dL (ref 1.9–3.7)
Indirect Bilirubin: 0.3 mg/dL (ref 0.2–1.2)
Total Bilirubin: 0.3 mg/dL (ref 0.2–1.2)
Total Protein: 7.3 g/dL (ref 6.1–8.1)

## 2024-04-13 LAB — BASIC METABOLIC PANEL WITH GFR
BUN: 16 mg/dL (ref 7–25)
CO2: 29 mmol/L (ref 20–32)
Calcium: 10.2 mg/dL (ref 8.6–10.4)
Chloride: 104 mmol/L (ref 98–110)
Creat: 0.73 mg/dL (ref 0.50–1.05)
Glucose, Bld: 82 mg/dL (ref 65–99)
Potassium: 4.5 mmol/L (ref 3.5–5.3)
Sodium: 141 mmol/L (ref 135–146)
eGFR: 93 mL/min/1.73m2 (ref >=60)

## 2024-04-13 LAB — CBC WITH DIFFERENTIAL/PLATELET
Absolute Lymphocytes: 4059 {cells}/uL — ABNORMAL HIGH (ref 850–3900)
Absolute Monocytes: 446 {cells}/uL (ref 200–950)
Basophils Absolute: 73 {cells}/uL (ref 0–200)
Basophils Relative: 0.8 %
Eosinophils Absolute: 300 {cells}/uL (ref 15–500)
Eosinophils Relative: 3.3 %
HCT: 42.2 % (ref 35.0–45.0)
Hemoglobin: 13.4 g/dL (ref 11.7–15.5)
MCH: 29.7 pg (ref 27.0–33.0)
MCHC: 31.8 g/dL — ABNORMAL LOW (ref 32.0–36.0)
MCV: 93.6 fL (ref 80.0–100.0)
MPV: 9.6 fL (ref 7.5–12.5)
Monocytes Relative: 4.9 %
Neutro Abs: 4222 {cells}/uL (ref 1500–7800)
Neutrophils Relative %: 46.4 %
Platelets: 414 Thousand/uL — ABNORMAL HIGH (ref 140–400)
RBC: 4.51 Million/uL (ref 3.80–5.10)
RDW: 12.7 % (ref 11.0–15.0)
Total Lymphocyte: 44.6 %
WBC: 9.1 Thousand/uL (ref 3.8–10.8)

## 2024-04-13 LAB — LIPID PANEL
Cholesterol: 160 mg/dL (ref <200)
HDL: 57 mg/dL (ref >=50)
LDL Cholesterol (Calc): 84 mg/dL
Non-HDL Cholesterol (Calc): 103 mg/dL (ref <130)
Total CHOL/HDL Ratio: 2.8 (calc) (ref <5.0)
Triglycerides: 96 mg/dL (ref <150)

## 2024-04-13 LAB — TSH: TSH: 0.31 m[IU]/L — ABNORMAL LOW (ref 0.40–4.50)

## 2024-04-13 LAB — HEMOGLOBIN A1C
Hgb A1c MFr Bld: 5.7 % — ABNORMAL HIGH (ref <5.7)
Mean Plasma Glucose: 117 mg/dL
eAG (mmol/L): 6.5 mmol/L

## 2024-04-16 ENCOUNTER — Ambulatory Visit: Payer: Self-pay | Admitting: Family Medicine

## 2024-04-16 DIAGNOSIS — R7989 Other specified abnormal findings of blood chemistry: Secondary | ICD-10-CM

## 2024-04-16 NOTE — Telephone Encounter (Signed)
 Called patient and relayed lab results  Scheduled 1 month lab visit  Ordered future labs

## 2024-04-16 NOTE — Telephone Encounter (Signed)
-----   Message from Comer Greet sent at 04/16/2024  7:46 AM EDT ----- Labs look great!!  Your TSH (thyroid  stimulating hormone) is a little low so we are going to repeat your TSH level and do a free T3 and free T4 in 1 month at a lab only visit.  This will make sure things are back in  normal range. (TSH, free T3, free T4- dx abnormal TSH) ----- Message ----- From: Interface, [facility] Lab Results In Sent: 04/12/2024  10:52 PM EDT To: Comer FORBES Greet, MD

## 2024-05-16 ENCOUNTER — Other Ambulatory Visit (INDEPENDENT_AMBULATORY_CARE_PROVIDER_SITE_OTHER)

## 2024-05-16 ENCOUNTER — Ambulatory Visit: Payer: Self-pay | Admitting: Family Medicine

## 2024-05-16 DIAGNOSIS — R946 Abnormal results of thyroid function studies: Secondary | ICD-10-CM | POA: Diagnosis not present

## 2024-05-16 DIAGNOSIS — R7989 Other specified abnormal findings of blood chemistry: Secondary | ICD-10-CM

## 2024-05-16 LAB — TSH: TSH: 0.39 u[IU]/mL (ref 0.35–5.50)

## 2024-05-16 LAB — T3, FREE: T3, Free: 3.3 pg/mL (ref 2.3–4.2)

## 2024-05-16 LAB — T4, FREE: Free T4: 0.62 ng/dL (ref 0.60–1.60)

## 2024-06-06 ENCOUNTER — Encounter: Payer: Self-pay | Admitting: Family Medicine

## 2024-08-15 NOTE — Progress Notes (Unsigned)
 "  NEUROLOGY FOLLOW UP OFFICE NOTE  Gabriela Stephens  [id number]  Assessment/Plan:   Multiple sclerosis- never on DMT due to concerns of potential side effects.  Clinically doing well.   D3 4000 IU daily Follow up one year     Subjective:  Gabriela Stephens  is a 64 year old right-handed female who follows up for multiple sclerosis.   UPDATE: DMT:  None Other:  D3 4000 IU daily   Vision:  No issues Motor:  No issue Sensory:  No issue Pain: No issues Gait:  good.  Slightly wobbly but no falls. Bowel/bladder:  No issue Fatigue:  no Cognition:  good Mood:  good   HISTORY:  She was diagnosed with multiple sclerosis in 1993, when she had her first flare, presenting as right sided numbness and weakness.  MRI of brain and cervical spine showed lesions.  She may have had a lumbar puncture.  She was treated with IV steroids in the hospital.  Symptoms lasted about 3 months.  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  Symptoms lasted 2 to 3 weeks.  She was again treated with steroids.  She had a second relapse in 1998 in which she had a recurrence of left optic neuritis and numbness and tingling of the extremities.  She was again treated with steroids.  It was recommended that she start Avonex but she deferred.  She was never on disease modifying therapy.  Over the years, she would have occasional periods of lethargy in which she would remain in bed for a few days.  Otherwise, she has not had any other chronic symptoms.  She denies bladder dysfunction, chronic pain or memory deficits.   In September 2018, she began experiencing some numbness, aches and heaviness in her right arm.  She also has been experiencing numbness and tingling in the hands and feet.  Her balance is also off.  These are symptoms concerning for possible relapse.  Symptoms are unchanged and have not progressed or improved.   I saw patient in 2018.  She had  deferred starting a DMT at that time due to concerns of potential side effects.  She was lost to follow up.   In 2022, she began experiencing intermittent burning sensation and itching in her bilateral proximal posterior arms and left lower abdomen.  No rash.  Went to urgent care on 11/14/2020 where she was given Benadryl  and Zyrtec for presumed dermatitis.  Symptoms persisted and she went to the ED on 11/27/2020 where she was simply prescribed a 6 day 21 tablet prednisone  taper and referred to neurology.    Imaging: 07/22/2021 MRI BRAIN & C-SPINE: stable compared to prior imaging from 06/05/2018 06/05/2018 MRI BRAIN WO:  Stable white matter hyperintensities compatible with history of demyelination and multiple sclerosis. No new lesion identified. 06/05/2018 MRI C-SPINE WO:  1. 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 No new cervical cord lesion identified.  2. Stable mild cervical spondylosis at the C4-5 and C5-6 levels. No high-grade foraminal or canal stenosis. 06/11/2017 MRI BRAIN W WO:  multiple T2 FLAIR  hyperintense lesions in the cerebral/periventricular and pericallosal white matter without enhancement, as well as single lesion in the right cerebellum.  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   06/11/2017 MRI C-SPINE W TN:[id number] left posterolateral cord lesion at C6 level.   Family history:  Paternal second cousin  has MS.  Sister has RA.  Father had ALS.  Mother had Alzheimer's disease.   PAST MEDICAL HISTORY: Past Medical History:  Diagnosis Date   Anemia    during pregancy   COVID 07/2020   hospitalized for 5 days   Hypertension    Multiple sclerosis 03/1992    MEDICATIONS: Current Outpatient Medications on File Prior to Visit  Medication Sig Dispense Refill   cholecalciferol  (VITAMIN D3) 25 MCG (1000 UNIT) tablet Take 1,000 Units by mouth daily.      metoprolol  tartrate (LOPRESSOR ) 25 MG tablet TAKE 1 TABLET(25 MG) BY MOUTH DAILY 90 tablet 1   Multiple Vitamin (MULTIVITAMIN WITH MINERALS) TABS tablet Take 1 tablet by mouth daily. Woman's 50+     rosuvastatin  (CRESTOR ) 10 MG tablet Take 1 tablet (10 mg total) by mouth daily. 90 tablet 1   zinc  gluconate 50 MG tablet Take 50 mg by mouth daily.     No current facility-administered medications on file prior to visit.    ALLERGIES: Allergies  Allergen Reactions   Gadolinium Other (See Comments)    Hypertension      FAMILY HISTORY: Family History  Problem Relation Age of Onset   Alzheimer's disease Mother    ALS Father       Objective:  *** General: No acute distress.  Patient appears well-groomed.   Head:  Normocephalic/atraumatic Neck:  Supple.  No paraspinal tenderness.  Full range of motion. Heart:  Regular rate and rhythm. Neuro:  Alert and oriented.  Speech fluent and not dysarthric.  Language intact.  CN II-XII intact.  Bulk and tone normal.  Muscle strength 5/5 throughout.  Sensation to light touch intact.  Deep tendon reflexes 2+ throughout, toes downgoing.  Gait normal.  Romberg negative.    Juliene Dunnings, DO  CC: Comer Greet, MD ***      "

## 2024-08-19 ENCOUNTER — Ambulatory Visit: Payer: BC Managed Care – PPO | Admitting: Neurology

## 2024-09-11 ENCOUNTER — Ambulatory Visit: Admitting: Neurology

## 2024-09-27 ENCOUNTER — Encounter: Admitting: Family Medicine
